# Patient Record
Sex: Female | Born: 1935 | Race: White | Hispanic: No | Marital: Single | Smoking: Former smoker
Health system: Southern US, Community
[De-identification: ages and names within clinical notes are randomized; demographics above are authoritative.]

## PROBLEM LIST (undated history)

## (undated) DIAGNOSIS — F319 Bipolar disorder, unspecified: Secondary | ICD-10-CM

## (undated) DIAGNOSIS — K219 Gastro-esophageal reflux disease without esophagitis: Secondary | ICD-10-CM

## (undated) DIAGNOSIS — E079 Disorder of thyroid, unspecified: Secondary | ICD-10-CM

## (undated) DIAGNOSIS — F329 Major depressive disorder, single episode, unspecified: Secondary | ICD-10-CM

## (undated) DIAGNOSIS — C801 Malignant (primary) neoplasm, unspecified: Secondary | ICD-10-CM

## (undated) DIAGNOSIS — I10 Essential (primary) hypertension: Secondary | ICD-10-CM

## (undated) DIAGNOSIS — C9210 Chronic myeloid leukemia, BCR/ABL-positive, not having achieved remission: Secondary | ICD-10-CM

## (undated) DIAGNOSIS — F32A Depression, unspecified: Secondary | ICD-10-CM

## (undated) HISTORY — PX: CHOLECYSTECTOMY: SHX55

## (undated) HISTORY — DX: Depression, unspecified: F32.A

## (undated) HISTORY — PX: TOTAL HIP ARTHROPLASTY: SHX124

## (undated) HISTORY — DX: Major depressive disorder, single episode, unspecified: F32.9

## (undated) HISTORY — DX: Disorder of thyroid, unspecified: E07.9

## (undated) HISTORY — DX: Gastro-esophageal reflux disease without esophagitis: K21.9

## (undated) HISTORY — PX: COLON SURGERY: SHX602

## (undated) HISTORY — PX: ABDOMINAL HYSTERECTOMY: SHX81

## (undated) HISTORY — DX: Essential (primary) hypertension: I10

---

## 1997-12-10 ENCOUNTER — Other Ambulatory Visit: Admission: RE | Admit: 1997-12-10 | Discharge: 1997-12-10 | Payer: Self-pay | Admitting: Gynecology

## 1998-01-27 ENCOUNTER — Emergency Department (HOSPITAL_COMMUNITY): Admission: EM | Admit: 1998-01-27 | Discharge: 1998-01-27 | Payer: Self-pay | Admitting: Emergency Medicine

## 1998-01-28 ENCOUNTER — Encounter (HOSPITAL_COMMUNITY): Admission: RE | Admit: 1998-01-28 | Discharge: 1998-04-28 | Payer: Self-pay

## 1998-06-20 ENCOUNTER — Inpatient Hospital Stay (HOSPITAL_COMMUNITY): Admission: EM | Admit: 1998-06-20 | Discharge: 1998-07-07 | Payer: Self-pay | Admitting: Emergency Medicine

## 1998-07-08 ENCOUNTER — Encounter (HOSPITAL_COMMUNITY): Admission: RE | Admit: 1998-07-08 | Discharge: 1998-10-06 | Payer: Self-pay | Admitting: Specialist

## 1998-07-25 ENCOUNTER — Encounter: Payer: Self-pay | Admitting: Neurology

## 1998-07-25 ENCOUNTER — Inpatient Hospital Stay (HOSPITAL_COMMUNITY): Admission: EM | Admit: 1998-07-25 | Discharge: 1998-07-27 | Payer: Self-pay | Admitting: Emergency Medicine

## 1998-07-25 ENCOUNTER — Encounter: Payer: Self-pay | Admitting: Emergency Medicine

## 1999-01-10 ENCOUNTER — Other Ambulatory Visit: Admission: RE | Admit: 1999-01-10 | Discharge: 1999-01-10 | Payer: Self-pay | Admitting: Gynecology

## 1999-01-31 ENCOUNTER — Inpatient Hospital Stay (HOSPITAL_COMMUNITY): Admission: EM | Admit: 1999-01-31 | Discharge: 1999-02-03 | Payer: Self-pay | Admitting: Emergency Medicine

## 1999-01-31 ENCOUNTER — Encounter (INDEPENDENT_AMBULATORY_CARE_PROVIDER_SITE_OTHER): Payer: Self-pay | Admitting: Specialist

## 1999-02-02 ENCOUNTER — Encounter: Payer: Self-pay | Admitting: Internal Medicine

## 1999-07-21 ENCOUNTER — Inpatient Hospital Stay (HOSPITAL_COMMUNITY): Admission: AD | Admit: 1999-07-21 | Discharge: 1999-07-25 | Payer: Self-pay | Admitting: Cardiology

## 1999-07-21 ENCOUNTER — Encounter: Payer: Self-pay | Admitting: Cardiology

## 1999-07-28 ENCOUNTER — Encounter: Payer: Self-pay | Admitting: Internal Medicine

## 1999-07-28 ENCOUNTER — Inpatient Hospital Stay (HOSPITAL_COMMUNITY): Admission: EM | Admit: 1999-07-28 | Discharge: 1999-08-01 | Payer: Self-pay | Admitting: Emergency Medicine

## 2000-06-19 ENCOUNTER — Other Ambulatory Visit (HOSPITAL_COMMUNITY): Admission: RE | Admit: 2000-06-19 | Discharge: 2000-07-05 | Payer: Self-pay | Admitting: Psychiatry

## 2000-10-02 ENCOUNTER — Encounter: Admission: RE | Admit: 2000-10-02 | Discharge: 2000-10-02 | Payer: Self-pay | Admitting: Gynecology

## 2000-10-02 ENCOUNTER — Encounter: Payer: Self-pay | Admitting: Gynecology

## 2000-11-20 ENCOUNTER — Encounter: Payer: Self-pay | Admitting: Orthopedic Surgery

## 2000-11-25 ENCOUNTER — Inpatient Hospital Stay (HOSPITAL_COMMUNITY): Admission: RE | Admit: 2000-11-25 | Discharge: 2000-12-02 | Payer: Self-pay | Admitting: Orthopedic Surgery

## 2000-11-25 ENCOUNTER — Encounter: Payer: Self-pay | Admitting: Orthopedic Surgery

## 2001-01-20 ENCOUNTER — Encounter: Admission: RE | Admit: 2001-01-20 | Discharge: 2001-01-30 | Payer: Self-pay | Admitting: Orthopedic Surgery

## 2002-01-16 ENCOUNTER — Encounter: Payer: Self-pay | Admitting: Emergency Medicine

## 2002-01-16 ENCOUNTER — Emergency Department (HOSPITAL_COMMUNITY): Admission: EM | Admit: 2002-01-16 | Discharge: 2002-01-16 | Payer: Self-pay

## 2002-02-23 ENCOUNTER — Encounter: Admission: RE | Admit: 2002-02-23 | Discharge: 2002-05-24 | Payer: Self-pay | Admitting: Orthopedic Surgery

## 2002-05-25 ENCOUNTER — Encounter: Admission: RE | Admit: 2002-05-25 | Discharge: 2002-08-23 | Payer: Self-pay | Admitting: Orthopedic Surgery

## 2002-10-08 ENCOUNTER — Encounter: Payer: Self-pay | Admitting: Gynecology

## 2002-10-08 ENCOUNTER — Encounter: Admission: RE | Admit: 2002-10-08 | Discharge: 2002-10-08 | Payer: Self-pay | Admitting: Gynecology

## 2002-11-12 ENCOUNTER — Encounter: Payer: Self-pay | Admitting: Gynecology

## 2002-11-12 ENCOUNTER — Encounter: Admission: RE | Admit: 2002-11-12 | Discharge: 2002-11-12 | Payer: Self-pay | Admitting: Gynecology

## 2002-12-01 ENCOUNTER — Encounter: Payer: Self-pay | Admitting: Family Medicine

## 2002-12-01 ENCOUNTER — Ambulatory Visit (HOSPITAL_COMMUNITY): Admission: RE | Admit: 2002-12-01 | Discharge: 2002-12-01 | Payer: Self-pay | Admitting: Family Medicine

## 2002-12-29 ENCOUNTER — Encounter: Admission: RE | Admit: 2002-12-29 | Discharge: 2003-03-18 | Payer: Self-pay | Admitting: Family Medicine

## 2003-01-26 ENCOUNTER — Ambulatory Visit (HOSPITAL_COMMUNITY): Admission: RE | Admit: 2003-01-26 | Discharge: 2003-01-26 | Payer: Self-pay | Admitting: Gastroenterology

## 2003-01-26 ENCOUNTER — Encounter (INDEPENDENT_AMBULATORY_CARE_PROVIDER_SITE_OTHER): Payer: Self-pay | Admitting: Specialist

## 2003-02-19 ENCOUNTER — Encounter: Payer: Self-pay | Admitting: General Surgery

## 2003-02-23 ENCOUNTER — Encounter: Payer: Self-pay | Admitting: General Surgery

## 2003-02-23 ENCOUNTER — Encounter (INDEPENDENT_AMBULATORY_CARE_PROVIDER_SITE_OTHER): Payer: Self-pay | Admitting: *Deleted

## 2003-02-23 ENCOUNTER — Inpatient Hospital Stay (HOSPITAL_COMMUNITY): Admission: RE | Admit: 2003-02-23 | Discharge: 2003-03-05 | Payer: Self-pay | Admitting: General Surgery

## 2003-02-27 ENCOUNTER — Encounter: Payer: Self-pay | Admitting: General Surgery

## 2003-02-28 ENCOUNTER — Encounter: Payer: Self-pay | Admitting: General Surgery

## 2003-03-01 ENCOUNTER — Encounter: Payer: Self-pay | Admitting: General Surgery

## 2003-03-02 ENCOUNTER — Encounter (INDEPENDENT_AMBULATORY_CARE_PROVIDER_SITE_OTHER): Payer: Self-pay | Admitting: Cardiology

## 2003-04-12 ENCOUNTER — Ambulatory Visit (HOSPITAL_COMMUNITY): Admission: RE | Admit: 2003-04-12 | Discharge: 2003-04-12 | Payer: Self-pay | Admitting: General Surgery

## 2003-06-24 ENCOUNTER — Inpatient Hospital Stay (HOSPITAL_COMMUNITY): Admission: EM | Admit: 2003-06-24 | Discharge: 2003-07-05 | Payer: Self-pay | Admitting: Emergency Medicine

## 2003-09-08 ENCOUNTER — Ambulatory Visit (HOSPITAL_COMMUNITY): Admission: RE | Admit: 2003-09-08 | Discharge: 2003-09-08 | Payer: Self-pay | Admitting: Hematology & Oncology

## 2003-10-21 ENCOUNTER — Encounter: Admission: RE | Admit: 2003-10-21 | Discharge: 2003-10-21 | Payer: Self-pay | Admitting: Gynecology

## 2003-12-06 ENCOUNTER — Ambulatory Visit (HOSPITAL_COMMUNITY): Admission: RE | Admit: 2003-12-06 | Discharge: 2003-12-06 | Payer: Self-pay | Admitting: Hematology & Oncology

## 2004-01-13 ENCOUNTER — Ambulatory Visit (HOSPITAL_COMMUNITY): Admission: RE | Admit: 2004-01-13 | Discharge: 2004-01-13 | Payer: Self-pay | Admitting: Family Medicine

## 2004-01-20 ENCOUNTER — Ambulatory Visit (HOSPITAL_COMMUNITY): Admission: RE | Admit: 2004-01-20 | Discharge: 2004-01-20 | Payer: Self-pay | Admitting: Family Medicine

## 2004-01-24 ENCOUNTER — Ambulatory Visit (HOSPITAL_COMMUNITY): Admission: RE | Admit: 2004-01-24 | Discharge: 2004-01-24 | Payer: Self-pay | Admitting: Gastroenterology

## 2004-02-01 ENCOUNTER — Ambulatory Visit (HOSPITAL_BASED_OUTPATIENT_CLINIC_OR_DEPARTMENT_OTHER): Admission: RE | Admit: 2004-02-01 | Discharge: 2004-02-01 | Payer: Self-pay | Admitting: General Surgery

## 2004-02-01 ENCOUNTER — Ambulatory Visit (HOSPITAL_COMMUNITY): Admission: RE | Admit: 2004-02-01 | Discharge: 2004-02-01 | Payer: Self-pay | Admitting: General Surgery

## 2004-03-19 ENCOUNTER — Ambulatory Visit: Payer: Self-pay | Admitting: Hematology & Oncology

## 2004-05-18 ENCOUNTER — Ambulatory Visit (HOSPITAL_COMMUNITY): Admission: RE | Admit: 2004-05-18 | Discharge: 2004-05-18 | Payer: Self-pay | Admitting: Hematology & Oncology

## 2004-05-18 ENCOUNTER — Emergency Department (HOSPITAL_COMMUNITY): Admission: EM | Admit: 2004-05-18 | Discharge: 2004-05-18 | Payer: Self-pay | Admitting: Emergency Medicine

## 2004-06-09 ENCOUNTER — Ambulatory Visit: Payer: Self-pay | Admitting: Hematology & Oncology

## 2004-09-01 ENCOUNTER — Ambulatory Visit: Payer: Self-pay | Admitting: Hematology & Oncology

## 2004-09-05 ENCOUNTER — Other Ambulatory Visit: Admission: RE | Admit: 2004-09-05 | Discharge: 2004-09-05 | Payer: Self-pay | Admitting: Family Medicine

## 2004-10-20 ENCOUNTER — Ambulatory Visit: Payer: Self-pay | Admitting: Hematology & Oncology

## 2004-10-23 ENCOUNTER — Ambulatory Visit (HOSPITAL_COMMUNITY): Admission: RE | Admit: 2004-10-23 | Discharge: 2004-10-23 | Payer: Self-pay | Admitting: Hematology & Oncology

## 2004-11-07 ENCOUNTER — Encounter: Admission: RE | Admit: 2004-11-07 | Discharge: 2004-11-07 | Payer: Self-pay | Admitting: Family Medicine

## 2004-11-25 ENCOUNTER — Inpatient Hospital Stay (HOSPITAL_COMMUNITY): Admission: EM | Admit: 2004-11-25 | Discharge: 2004-11-27 | Payer: Self-pay | Admitting: Emergency Medicine

## 2005-02-08 ENCOUNTER — Ambulatory Visit: Payer: Self-pay | Admitting: Hematology & Oncology

## 2005-02-12 ENCOUNTER — Ambulatory Visit (HOSPITAL_COMMUNITY): Admission: RE | Admit: 2005-02-12 | Discharge: 2005-02-12 | Payer: Self-pay | Admitting: Hematology & Oncology

## 2005-06-21 ENCOUNTER — Ambulatory Visit: Payer: Self-pay | Admitting: Hematology & Oncology

## 2005-06-25 ENCOUNTER — Ambulatory Visit (HOSPITAL_COMMUNITY): Admission: RE | Admit: 2005-06-25 | Discharge: 2005-06-25 | Payer: Self-pay | Admitting: Hematology & Oncology

## 2005-08-24 ENCOUNTER — Ambulatory Visit (HOSPITAL_COMMUNITY): Admission: RE | Admit: 2005-08-24 | Discharge: 2005-08-24 | Payer: Self-pay | Admitting: Hematology & Oncology

## 2005-11-29 ENCOUNTER — Encounter: Admission: RE | Admit: 2005-11-29 | Discharge: 2005-11-29 | Payer: Self-pay | Admitting: Family Medicine

## 2005-12-05 ENCOUNTER — Ambulatory Visit (HOSPITAL_COMMUNITY): Admission: RE | Admit: 2005-12-05 | Discharge: 2005-12-05 | Payer: Self-pay | Admitting: Hematology & Oncology

## 2005-12-20 ENCOUNTER — Ambulatory Visit: Payer: Self-pay | Admitting: Hematology & Oncology

## 2005-12-20 LAB — CBC WITH DIFFERENTIAL/PLATELET
EOS%: 0.1 % (ref 0.0–7.0)
Eosinophils Absolute: 0 10*3/uL (ref 0.0–0.5)
LYMPH%: 11.6 % — ABNORMAL LOW (ref 14.0–48.0)
MCH: 37.6 pg — ABNORMAL HIGH (ref 26.0–34.0)
MCHC: 34.1 g/dL (ref 32.0–36.0)
MCV: 110.2 fL — ABNORMAL HIGH (ref 81.0–101.0)
MONO%: 5.8 % (ref 0.0–13.0)
NEUT#: 5 10*3/uL (ref 1.5–6.5)
Platelets: 271 10*3/uL (ref 145–400)
RBC: 3.51 10*6/uL — ABNORMAL LOW (ref 3.70–5.32)

## 2005-12-20 LAB — COMPREHENSIVE METABOLIC PANEL
ALT: 12 U/L (ref 0–40)
Alkaline Phosphatase: 102 U/L (ref 39–117)
CO2: 24 mEq/L (ref 19–32)
Creatinine, Ser: 0.59 mg/dL (ref 0.40–1.20)
Total Bilirubin: 0.5 mg/dL (ref 0.3–1.2)

## 2005-12-20 LAB — CHCC SMEAR

## 2005-12-20 LAB — CEA: CEA: 1.7 ng/mL (ref 0.0–5.0)

## 2006-03-19 ENCOUNTER — Ambulatory Visit: Payer: Self-pay | Admitting: Hematology & Oncology

## 2006-03-21 LAB — CBC WITH DIFFERENTIAL/PLATELET
BASO%: 0.5 % (ref 0.0–2.0)
Basophils Absolute: 0.1 10*3/uL (ref 0.0–0.1)
EOS%: 0.3 % (ref 0.0–7.0)
HGB: 12.9 g/dL (ref 11.6–15.9)
MCH: 37.8 pg — ABNORMAL HIGH (ref 26.0–34.0)
MCHC: 33.9 g/dL (ref 32.0–36.0)
MONO#: 0.6 10*3/uL (ref 0.1–0.9)
RDW: 15.8 % — ABNORMAL HIGH (ref 11.3–14.5)
WBC: 12 10*3/uL — ABNORMAL HIGH (ref 3.9–10.0)
lymph#: 1.2 10*3/uL (ref 0.9–3.3)

## 2006-03-21 LAB — CEA: CEA: 1.9 ng/mL (ref 0.0–5.0)

## 2006-08-19 ENCOUNTER — Ambulatory Visit (HOSPITAL_COMMUNITY): Payer: Self-pay | Admitting: Psychiatry

## 2006-09-02 ENCOUNTER — Ambulatory Visit (HOSPITAL_COMMUNITY): Payer: Self-pay | Admitting: Psychiatry

## 2006-09-09 ENCOUNTER — Ambulatory Visit (HOSPITAL_COMMUNITY): Payer: Self-pay | Admitting: Licensed Clinical Social Worker

## 2006-09-09 ENCOUNTER — Ambulatory Visit: Payer: Self-pay | Admitting: Hematology & Oncology

## 2006-09-12 LAB — LACTATE DEHYDROGENASE: LDH: 194 U/L (ref 94–250)

## 2006-09-12 LAB — CBC WITH DIFFERENTIAL/PLATELET
Basophils Absolute: 0 10*3/uL (ref 0.0–0.1)
HCT: 36.6 % (ref 34.8–46.6)
HGB: 12.6 g/dL (ref 11.6–15.9)
MONO#: 0.7 10*3/uL (ref 0.1–0.9)
NEUT%: 79.5 % — ABNORMAL HIGH (ref 39.6–76.8)
WBC: 9.5 10*3/uL (ref 3.9–10.0)
lymph#: 1.1 10*3/uL (ref 0.9–3.3)

## 2006-09-12 LAB — COMPREHENSIVE METABOLIC PANEL
ALT: 15 U/L (ref 0–35)
Albumin: 4.2 g/dL (ref 3.5–5.2)
BUN: 16 mg/dL (ref 6–23)
CO2: 27 mEq/L (ref 19–32)
Calcium: 9.3 mg/dL (ref 8.4–10.5)
Chloride: 103 mEq/L (ref 96–112)
Creatinine, Ser: 0.61 mg/dL (ref 0.40–1.20)

## 2006-09-19 ENCOUNTER — Ambulatory Visit (HOSPITAL_COMMUNITY): Admission: RE | Admit: 2006-09-19 | Discharge: 2006-09-19 | Payer: Self-pay | Admitting: Hematology & Oncology

## 2006-10-08 ENCOUNTER — Ambulatory Visit (HOSPITAL_COMMUNITY): Payer: Self-pay | Admitting: Licensed Clinical Social Worker

## 2006-10-28 ENCOUNTER — Ambulatory Visit (HOSPITAL_COMMUNITY): Payer: Self-pay | Admitting: Psychiatry

## 2006-12-02 ENCOUNTER — Encounter: Admission: RE | Admit: 2006-12-02 | Discharge: 2006-12-02 | Payer: Self-pay | Admitting: Gynecology

## 2007-01-15 ENCOUNTER — Ambulatory Visit: Payer: Self-pay | Admitting: Hematology & Oncology

## 2007-01-17 LAB — COMPREHENSIVE METABOLIC PANEL
AST: 13 U/L (ref 0–37)
BUN: 10 mg/dL (ref 6–23)
CO2: 23 mEq/L (ref 19–32)
Calcium: 8.4 mg/dL (ref 8.4–10.5)
Chloride: 105 mEq/L (ref 96–112)
Creatinine, Ser: 0.58 mg/dL (ref 0.40–1.20)

## 2007-01-17 LAB — CBC WITH DIFFERENTIAL/PLATELET
BASO%: 0.2 % (ref 0.0–2.0)
Eosinophils Absolute: 0.1 10*3/uL (ref 0.0–0.5)
MCHC: 35 g/dL (ref 32.0–36.0)
MCV: 110 fL — ABNORMAL HIGH (ref 81.0–101.0)
MONO%: 5.9 % (ref 0.0–13.0)
NEUT#: 15.6 10*3/uL — ABNORMAL HIGH (ref 1.5–6.5)
RBC: 3.27 10*6/uL — ABNORMAL LOW (ref 3.70–5.32)
RDW: 15 % — ABNORMAL HIGH (ref 11.3–14.5)
WBC: 18.3 10*3/uL — ABNORMAL HIGH (ref 3.9–10.0)

## 2007-01-17 LAB — CHCC SMEAR

## 2007-01-17 LAB — LACTATE DEHYDROGENASE: LDH: 343 U/L — ABNORMAL HIGH (ref 94–250)

## 2007-01-20 ENCOUNTER — Ambulatory Visit (HOSPITAL_COMMUNITY): Payer: Self-pay | Admitting: Psychiatry

## 2007-02-18 LAB — CBC WITH DIFFERENTIAL/PLATELET
Eosinophils Absolute: 0 10*3/uL (ref 0.0–0.5)
MONO#: 1.1 10*3/uL — ABNORMAL HIGH (ref 0.1–0.9)
NEUT#: 13.7 10*3/uL — ABNORMAL HIGH (ref 1.5–6.5)
RBC: 3.1 10*6/uL — ABNORMAL LOW (ref 3.70–5.32)
RDW: 15 % — ABNORMAL HIGH (ref 11.3–14.5)
WBC: 16.5 10*3/uL — ABNORMAL HIGH (ref 3.9–10.0)
lymph#: 1.5 10*3/uL (ref 0.9–3.3)

## 2007-02-18 LAB — TECHNOLOGIST REVIEW

## 2007-02-27 LAB — BCR/ABL

## 2007-03-13 ENCOUNTER — Ambulatory Visit: Payer: Self-pay | Admitting: Hematology & Oncology

## 2007-03-17 LAB — CBC WITH DIFFERENTIAL/PLATELET
Basophils Absolute: 0.8 10*3/uL — ABNORMAL HIGH (ref 0.0–0.1)
Eosinophils Absolute: 0 10*3/uL (ref 0.0–0.5)
HGB: 12.6 g/dL (ref 11.6–15.9)
MONO#: 1.7 10*3/uL — ABNORMAL HIGH (ref 0.1–0.9)
NEUT#: 9.5 10*3/uL — ABNORMAL HIGH (ref 1.5–6.5)
RDW: 13.2 % (ref 11.3–14.5)
WBC: 13.3 10*3/uL — ABNORMAL HIGH (ref 3.9–10.0)
lymph#: 1.2 10*3/uL (ref 0.9–3.3)

## 2007-03-24 LAB — CBC WITH DIFFERENTIAL/PLATELET
Eosinophils Absolute: 0 10*3/uL (ref 0.0–0.5)
HCT: 36.5 % (ref 34.8–46.6)
LYMPH%: 13.4 % — ABNORMAL LOW (ref 14.0–48.0)
MONO#: 0.9 10*3/uL (ref 0.1–0.9)
NEUT#: 7.4 10*3/uL — ABNORMAL HIGH (ref 1.5–6.5)
NEUT%: 71.7 % (ref 39.6–76.8)
Platelets: 307 10*3/uL (ref 145–400)
RBC: 3.36 10*6/uL — ABNORMAL LOW (ref 3.70–5.32)
WBC: 10.3 10*3/uL — ABNORMAL HIGH (ref 3.9–10.0)

## 2007-03-24 LAB — TECHNOLOGIST REVIEW

## 2007-03-31 LAB — CBC WITH DIFFERENTIAL/PLATELET
BASO%: 0.2 % (ref 0.0–2.0)
Basophils Absolute: 0 10*3/uL (ref 0.0–0.1)
EOS%: 0.2 % (ref 0.0–7.0)
HCT: 35.3 % (ref 34.8–46.6)
HGB: 12.3 g/dL (ref 11.6–15.9)
MONO#: 0.4 10*3/uL (ref 0.1–0.9)
NEUT#: 8.6 10*3/uL — ABNORMAL HIGH (ref 1.5–6.5)
NEUT%: 88.6 % — ABNORMAL HIGH (ref 39.6–76.8)
RDW: 14.9 % — ABNORMAL HIGH (ref 11.3–14.5)
WBC: 9.7 10*3/uL (ref 3.9–10.0)
lymph#: 0.6 10*3/uL — ABNORMAL LOW (ref 0.9–3.3)

## 2007-04-07 LAB — CBC WITH DIFFERENTIAL/PLATELET
Basophils Absolute: 0 10*3/uL (ref 0.0–0.1)
EOS%: 0.3 % (ref 0.0–7.0)
Eosinophils Absolute: 0 10*3/uL (ref 0.0–0.5)
HCT: 34.7 % — ABNORMAL LOW (ref 34.8–46.6)
HGB: 12 g/dL (ref 11.6–15.9)
MCH: 38.1 pg — ABNORMAL HIGH (ref 26.0–34.0)
MCV: 110.2 fL — ABNORMAL HIGH (ref 81.0–101.0)
NEUT#: 3.5 10*3/uL (ref 1.5–6.5)
NEUT%: 78.9 % — ABNORMAL HIGH (ref 39.6–76.8)
RDW: 14.3 % (ref 11.3–14.5)
lymph#: 0.7 10*3/uL — ABNORMAL LOW (ref 0.9–3.3)

## 2007-04-16 LAB — CBC WITH DIFFERENTIAL/PLATELET
Basophils Absolute: 0 10*3/uL (ref 0.0–0.1)
EOS%: 0 % (ref 0.0–7.0)
Eosinophils Absolute: 0 10*3/uL (ref 0.0–0.5)
HGB: 11 g/dL — ABNORMAL LOW (ref 11.6–15.9)
LYMPH%: 12.8 % — ABNORMAL LOW (ref 14.0–48.0)
MCH: 38.3 pg — ABNORMAL HIGH (ref 26.0–34.0)
MCV: 108.6 fL — ABNORMAL HIGH (ref 81.0–101.0)
MONO%: 8.6 % (ref 0.0–13.0)
NEUT#: 2.8 10*3/uL (ref 1.5–6.5)
NEUT%: 77.7 % — ABNORMAL HIGH (ref 39.6–76.8)
Platelets: 175 10*3/uL (ref 145–400)

## 2007-04-21 LAB — CBC WITH DIFFERENTIAL/PLATELET
Basophils Absolute: 0 10*3/uL (ref 0.0–0.1)
Eosinophils Absolute: 0 10*3/uL (ref 0.0–0.5)
HCT: 34.1 % — ABNORMAL LOW (ref 34.8–46.6)
HGB: 11.9 g/dL (ref 11.6–15.9)
NEUT#: 2.8 10*3/uL (ref 1.5–6.5)
NEUT%: 74.4 % (ref 39.6–76.8)
RDW: 13.3 % (ref 11.3–14.5)
lymph#: 0.6 10*3/uL — ABNORMAL LOW (ref 0.9–3.3)

## 2007-04-24 ENCOUNTER — Ambulatory Visit: Payer: Self-pay | Admitting: Hematology & Oncology

## 2007-04-28 ENCOUNTER — Ambulatory Visit: Payer: Self-pay | Admitting: Psychiatry

## 2007-04-28 LAB — CBC WITH DIFFERENTIAL/PLATELET
Basophils Absolute: 0.1 10*3/uL (ref 0.0–0.1)
Eosinophils Absolute: 0 10*3/uL (ref 0.0–0.5)
HCT: 35.6 % (ref 34.8–46.6)
HGB: 12.4 g/dL (ref 11.6–15.9)
LYMPH%: 10.2 % — ABNORMAL LOW (ref 14.0–48.0)
MCV: 105 fL — ABNORMAL HIGH (ref 81.0–101.0)
MONO%: 12.8 % (ref 0.0–13.0)
NEUT#: 3.7 10*3/uL (ref 1.5–6.5)
NEUT%: 75.1 % (ref 39.6–76.8)
Platelets: 323 10*3/uL (ref 145–400)

## 2007-05-05 ENCOUNTER — Other Ambulatory Visit: Payer: Self-pay | Admitting: Hematology & Oncology

## 2007-05-05 LAB — CBC WITH DIFFERENTIAL/PLATELET
Eosinophils Absolute: 0 10*3/uL (ref 0.0–0.5)
LYMPH%: 12.4 % — ABNORMAL LOW (ref 14.0–48.0)
MCH: 36.2 pg — ABNORMAL HIGH (ref 26.0–34.0)
MCHC: 34.4 g/dL (ref 32.0–36.0)
MCV: 105.2 fL — ABNORMAL HIGH (ref 81.0–101.0)
MONO%: 5.5 % (ref 0.0–13.0)
NEUT#: 4.9 10*3/uL (ref 1.5–6.5)
Platelets: 343 10*3/uL (ref 145–400)
RBC: 3.56 10*6/uL — ABNORMAL LOW (ref 3.70–5.32)

## 2007-05-12 LAB — CBC WITH DIFFERENTIAL/PLATELET
BASO%: 0.9 % (ref 0.0–2.0)
LYMPH%: 16 % (ref 14.0–48.0)
MCHC: 33.9 g/dL (ref 32.0–36.0)
MONO#: 0.3 10*3/uL (ref 0.1–0.9)
MONO%: 7.2 % (ref 0.0–13.0)
Platelets: 247 10*3/uL (ref 145–400)
RBC: 3.55 10*6/uL — ABNORMAL LOW (ref 3.70–5.32)
WBC: 4.7 10*3/uL (ref 3.9–10.0)

## 2007-05-30 LAB — CBC WITH DIFFERENTIAL/PLATELET
BASO%: 0.2 % (ref 0.0–2.0)
HCT: 37.7 % (ref 34.8–46.6)
MCHC: 33.8 g/dL (ref 32.0–36.0)
MONO#: 0.3 10*3/uL (ref 0.1–0.9)
RBC: 3.73 10*6/uL (ref 3.70–5.32)
RDW: 13.4 % (ref 11.3–14.5)
WBC: 4.7 10*3/uL (ref 3.9–10.0)
lymph#: 0.6 10*3/uL — ABNORMAL LOW (ref 0.9–3.3)

## 2007-06-02 ENCOUNTER — Ambulatory Visit (HOSPITAL_COMMUNITY): Admission: RE | Admit: 2007-06-02 | Discharge: 2007-06-02 | Payer: Self-pay | Admitting: Hematology & Oncology

## 2007-06-04 ENCOUNTER — Ambulatory Visit (HOSPITAL_COMMUNITY): Payer: Self-pay | Admitting: Psychiatry

## 2007-06-09 ENCOUNTER — Ambulatory Visit: Payer: Self-pay | Admitting: Hematology & Oncology

## 2007-06-11 LAB — CBC WITH DIFFERENTIAL/PLATELET
BASO%: 0.4 % (ref 0.0–2.0)
Basophils Absolute: 0 10*3/uL (ref 0.0–0.1)
HCT: 36.8 % (ref 34.8–46.6)
HGB: 12.6 g/dL (ref 11.6–15.9)
MONO#: 0.5 10*3/uL (ref 0.1–0.9)
NEUT%: 79 % — ABNORMAL HIGH (ref 39.6–76.8)
WBC: 5.5 10*3/uL (ref 3.9–10.0)
lymph#: 0.6 10*3/uL — ABNORMAL LOW (ref 0.9–3.3)

## 2007-06-25 LAB — CBC WITH DIFFERENTIAL/PLATELET
BASO%: 0.9 % (ref 0.0–2.0)
Basophils Absolute: 0 10*3/uL (ref 0.0–0.1)
EOS%: 0.9 % (ref 0.0–7.0)
HGB: 13.6 g/dL (ref 11.6–15.9)
MCH: 33.4 pg (ref 26.0–34.0)
RDW: 13.2 % (ref 11.3–14.5)
lymph#: 0.7 10*3/uL — ABNORMAL LOW (ref 0.9–3.3)

## 2007-06-25 LAB — CHCC SMEAR

## 2007-06-26 LAB — COMPREHENSIVE METABOLIC PANEL
ALT: 12 U/L (ref 0–35)
AST: 16 U/L (ref 0–37)
Albumin: 4.3 g/dL (ref 3.5–5.2)
Alkaline Phosphatase: 95 U/L (ref 39–117)
Glucose, Bld: 125 mg/dL — ABNORMAL HIGH (ref 70–99)
Potassium: 4.2 mEq/L (ref 3.5–5.3)
Sodium: 140 mEq/L (ref 135–145)
Total Protein: 7.2 g/dL (ref 6.0–8.3)

## 2007-06-26 LAB — CEA: CEA: 2.6 ng/mL (ref 0.0–5.0)

## 2007-07-10 LAB — BCR/ABL

## 2007-07-11 LAB — CBC WITH DIFFERENTIAL/PLATELET
BASO%: 0.6 % (ref 0.0–2.0)
EOS%: 0.5 % (ref 0.0–7.0)
HCT: 43.8 % (ref 34.8–46.6)
MCH: 33 pg (ref 26.0–34.0)
MCHC: 34.9 g/dL (ref 32.0–36.0)
MONO#: 0.7 10*3/uL (ref 0.1–0.9)
NEUT%: 74.1 % (ref 39.6–76.8)
RBC: 4.63 10*6/uL (ref 3.70–5.32)
WBC: 6.5 10*3/uL (ref 3.9–10.0)
lymph#: 0.9 10*3/uL (ref 0.9–3.3)

## 2007-07-18 ENCOUNTER — Ambulatory Visit: Payer: Self-pay | Admitting: Psychology

## 2007-07-18 ENCOUNTER — Encounter: Admission: RE | Admit: 2007-07-18 | Discharge: 2007-07-21 | Payer: Self-pay | Admitting: Family Medicine

## 2007-08-01 ENCOUNTER — Ambulatory Visit: Payer: Self-pay | Admitting: Hematology & Oncology

## 2007-09-03 LAB — CBC WITH DIFFERENTIAL/PLATELET
BASO%: 0.1 % (ref 0.0–2.0)
Basophils Absolute: 0 10*3/uL (ref 0.0–0.1)
EOS%: 0.8 % (ref 0.0–7.0)
HCT: 37.4 % (ref 34.8–46.6)
HGB: 13.1 g/dL (ref 11.6–15.9)
LYMPH%: 15.9 % (ref 14.0–48.0)
MCH: 32.9 pg (ref 26.0–34.0)
MCHC: 34.9 g/dL (ref 32.0–36.0)
MCV: 94.3 fL (ref 81.0–101.0)
MONO%: 7.7 % (ref 0.0–13.0)
NEUT%: 75.5 % (ref 39.6–76.8)
Platelets: 249 10*3/uL (ref 145–400)

## 2007-09-03 LAB — CHCC SMEAR

## 2007-09-17 LAB — BCR/ABL

## 2007-10-27 ENCOUNTER — Ambulatory Visit: Payer: Self-pay | Admitting: Hematology & Oncology

## 2007-10-29 LAB — CBC WITH DIFFERENTIAL/PLATELET
BASO%: 0.7 % (ref 0.0–2.0)
Eosinophils Absolute: 0 10*3/uL (ref 0.0–0.5)
LYMPH%: 13.9 % — ABNORMAL LOW (ref 14.0–48.0)
MCHC: 34.3 g/dL (ref 32.0–36.0)
MCV: 95.1 fL (ref 81.0–101.0)
MONO#: 0.3 10*3/uL (ref 0.1–0.9)
MONO%: 6.2 % (ref 0.0–13.0)
NEUT#: 4.1 10*3/uL (ref 1.5–6.5)
Platelets: 321 10*3/uL (ref 145–400)
RBC: 4.2 10*6/uL (ref 3.70–5.32)
RDW: 13.4 % (ref 11.3–14.5)
WBC: 5.3 10*3/uL (ref 3.9–10.0)

## 2007-10-29 LAB — COMPREHENSIVE METABOLIC PANEL
ALT: 12 U/L (ref 0–35)
AST: 15 U/L (ref 0–37)
CO2: 23 mEq/L (ref 19–32)
Creatinine, Ser: 0.66 mg/dL (ref 0.40–1.20)
Sodium: 136 mEq/L (ref 135–145)
Total Bilirubin: 0.4 mg/dL (ref 0.3–1.2)
Total Protein: 7.3 g/dL (ref 6.0–8.3)

## 2007-10-29 LAB — CHCC SMEAR

## 2007-12-25 ENCOUNTER — Encounter: Admission: RE | Admit: 2007-12-25 | Discharge: 2007-12-25 | Payer: Self-pay | Admitting: Gynecology

## 2008-01-13 ENCOUNTER — Ambulatory Visit (HOSPITAL_COMMUNITY): Admission: RE | Admit: 2008-01-13 | Discharge: 2008-01-13 | Payer: Self-pay | Admitting: Hematology & Oncology

## 2008-01-21 ENCOUNTER — Ambulatory Visit: Payer: Self-pay | Admitting: Hematology & Oncology

## 2008-01-22 LAB — CHCC SATELLITE - SMEAR

## 2008-01-22 LAB — COMPREHENSIVE METABOLIC PANEL
ALT: 12 U/L (ref 0–35)
Albumin: 4.7 g/dL (ref 3.5–5.2)
CO2: 21 mEq/L (ref 19–32)
Glucose, Bld: 130 mg/dL — ABNORMAL HIGH (ref 70–99)
Potassium: 4 mEq/L (ref 3.5–5.3)
Sodium: 140 mEq/L (ref 135–145)
Total Protein: 7.7 g/dL (ref 6.0–8.3)

## 2008-01-22 LAB — MANUAL DIFFERENTIAL (CHCC SATELLITE)
ALC: 0.9 10*3/uL (ref 0.6–2.2)
ANC (CHCC HP manual diff): 2.5 10*3/uL (ref 1.5–6.7)
PLT EST ~~LOC~~: ADEQUATE

## 2008-01-22 LAB — CBC WITH DIFFERENTIAL (CANCER CENTER ONLY)
Platelets: 214 10*3/uL (ref 145–400)
RDW: 12.2 % (ref 10.5–14.6)
WBC: 3.9 10*3/uL (ref 3.9–10.0)

## 2008-03-09 ENCOUNTER — Ambulatory Visit: Payer: Self-pay | Admitting: Hematology & Oncology

## 2008-03-11 LAB — CBC WITH DIFFERENTIAL/PLATELET
BASO%: 0.6 % (ref 0.0–2.0)
Basophils Absolute: 0 10*3/uL (ref 0.0–0.1)
EOS%: 0.7 % (ref 0.0–7.0)
HGB: 13.3 g/dL (ref 11.6–15.9)
MCH: 33.1 pg (ref 26.0–34.0)
MCHC: 34.8 g/dL (ref 32.0–36.0)
MONO%: 7.4 % (ref 0.0–13.0)
RBC: 4.03 10*6/uL (ref 3.70–5.32)
RDW: 13.2 % (ref 11.3–14.5)
lymph#: 0.9 10*3/uL (ref 0.9–3.3)

## 2008-04-21 ENCOUNTER — Ambulatory Visit: Payer: Self-pay | Admitting: Hematology & Oncology

## 2008-05-05 LAB — CBC WITH DIFFERENTIAL (CANCER CENTER ONLY)
BASO%: 0.4 % (ref 0.0–2.0)
EOS%: 2.1 % (ref 0.0–7.0)
LYMPH%: 17.8 % (ref 14.0–48.0)
MCH: 32 pg (ref 26.0–34.0)
MCHC: 34 g/dL (ref 32.0–36.0)
MCV: 94 fL (ref 81–101)
MONO%: 6.9 % (ref 0.0–13.0)
Platelets: 290 10*3/uL (ref 145–400)
RDW: 11.4 % (ref 10.5–14.6)
WBC: 4.4 10*3/uL (ref 3.9–10.0)

## 2008-05-05 LAB — CHCC SATELLITE - SMEAR

## 2008-05-17 LAB — BCR/ABL (CHCC SATELLITE)

## 2008-07-12 ENCOUNTER — Ambulatory Visit (HOSPITAL_COMMUNITY): Payer: Self-pay | Admitting: Psychiatry

## 2008-07-20 ENCOUNTER — Ambulatory Visit: Payer: Self-pay | Admitting: Hematology & Oncology

## 2008-07-21 LAB — CBC WITH DIFFERENTIAL (CANCER CENTER ONLY)
BASO#: 0 10*3/uL (ref 0.0–0.2)
Eosinophils Absolute: 0.1 10*3/uL (ref 0.0–0.5)
LYMPH%: 10.1 % — ABNORMAL LOW (ref 14.0–48.0)
MCH: 31.9 pg (ref 26.0–34.0)
MCV: 96 fL (ref 81–101)
MONO%: 3.6 % (ref 0.0–13.0)
Platelets: 316 10*3/uL (ref 145–400)
RBC: 4.15 10*6/uL (ref 3.70–5.32)

## 2008-07-28 LAB — BCR/ABL (CHCC SATELLITE)

## 2008-10-19 ENCOUNTER — Ambulatory Visit: Payer: Self-pay | Admitting: Hematology & Oncology

## 2008-10-28 ENCOUNTER — Ambulatory Visit: Payer: Self-pay | Admitting: Hematology & Oncology

## 2008-10-28 LAB — COMPREHENSIVE METABOLIC PANEL
AST: 32 U/L (ref 0–37)
BUN: 18 mg/dL (ref 6–23)
Calcium: 9.1 mg/dL (ref 8.4–10.5)
Chloride: 107 mEq/L (ref 96–112)
Creatinine, Ser: 0.68 mg/dL (ref 0.40–1.20)
Glucose, Bld: 115 mg/dL — ABNORMAL HIGH (ref 70–99)

## 2008-10-28 LAB — CBC WITH DIFFERENTIAL/PLATELET
Basophils Absolute: 0.1 10*3/uL (ref 0.0–0.1)
EOS%: 2.1 % (ref 0.0–7.0)
Eosinophils Absolute: 0.1 10*3/uL (ref 0.0–0.5)
HCT: 38.7 % (ref 34.8–46.6)
HGB: 13.8 g/dL (ref 11.6–15.9)
MCH: 33.9 pg (ref 25.1–34.0)
MCV: 94.9 fL (ref 79.5–101.0)
MONO%: 9.1 % (ref 0.0–14.0)
NEUT%: 69.9 % (ref 38.4–76.8)
lymph#: 1.1 10*3/uL (ref 0.9–3.3)

## 2008-11-18 ENCOUNTER — Ambulatory Visit (HOSPITAL_COMMUNITY): Admission: RE | Admit: 2008-11-18 | Discharge: 2008-11-18 | Payer: Self-pay | Admitting: Hematology & Oncology

## 2008-12-29 ENCOUNTER — Ambulatory Visit (HOSPITAL_COMMUNITY): Payer: Self-pay | Admitting: Psychiatry

## 2009-02-01 ENCOUNTER — Encounter: Admission: RE | Admit: 2009-02-01 | Discharge: 2009-02-01 | Payer: Self-pay | Admitting: Gynecology

## 2009-02-09 ENCOUNTER — Ambulatory Visit: Payer: Self-pay | Admitting: Hematology & Oncology

## 2009-02-10 LAB — CBC WITH DIFFERENTIAL (CANCER CENTER ONLY)
BASO#: 0 10*3/uL (ref 0.0–0.2)
Eosinophils Absolute: 0.1 10*3/uL (ref 0.0–0.5)
HCT: 36.5 % (ref 34.8–46.6)
HGB: 12.8 g/dL (ref 11.6–15.9)
MCH: 32.9 pg (ref 26.0–34.0)
MCV: 94 fL (ref 81–101)
MONO%: 6.6 % (ref 0.0–13.0)
NEUT#: 2.6 10*3/uL (ref 1.5–6.5)
RBC: 3.9 10*6/uL (ref 3.70–5.32)

## 2009-02-10 LAB — COMPREHENSIVE METABOLIC PANEL
AST: 17 U/L (ref 0–37)
Albumin: 4.2 g/dL (ref 3.5–5.2)
Alkaline Phosphatase: 66 U/L (ref 39–117)
Potassium: 4.3 mEq/L (ref 3.5–5.3)
Sodium: 129 mEq/L — ABNORMAL LOW (ref 135–145)
Total Protein: 6.6 g/dL (ref 6.0–8.3)

## 2009-02-10 LAB — CHCC SATELLITE - SMEAR

## 2009-02-21 LAB — BCR/ABL (CHCC SATELLITE)

## 2009-04-11 ENCOUNTER — Ambulatory Visit (HOSPITAL_COMMUNITY): Payer: Self-pay | Admitting: Psychiatry

## 2009-04-13 ENCOUNTER — Ambulatory Visit: Payer: Self-pay | Admitting: Hematology & Oncology

## 2009-04-14 LAB — CBC WITH DIFFERENTIAL (CANCER CENTER ONLY)
BASO#: 0 10*3/uL (ref 0.0–0.2)
EOS%: 1.1 % (ref 0.0–7.0)
Eosinophils Absolute: 0.1 10*3/uL (ref 0.0–0.5)
HGB: 12.9 g/dL (ref 11.6–15.9)
LYMPH#: 0.8 10*3/uL — ABNORMAL LOW (ref 0.9–3.3)
MONO#: 0.3 10*3/uL (ref 0.1–0.9)
NEUT#: 3.1 10*3/uL (ref 1.5–6.5)
RBC: 3.96 10*6/uL (ref 3.70–5.32)

## 2009-04-15 LAB — COMPREHENSIVE METABOLIC PANEL
CO2: 22 mEq/L (ref 19–32)
Creatinine, Ser: 0.77 mg/dL (ref 0.40–1.20)
Glucose, Bld: 97 mg/dL (ref 70–99)
Total Bilirubin: 0.5 mg/dL (ref 0.3–1.2)

## 2009-04-15 LAB — VITAMIN D 25 HYDROXY (VIT D DEFICIENCY, FRACTURES): Vit D, 25-Hydroxy: 8 ng/mL — ABNORMAL LOW (ref 30–89)

## 2009-04-21 LAB — BCR/ABL (CHCC SATELLITE)

## 2009-06-14 ENCOUNTER — Ambulatory Visit: Payer: Self-pay | Admitting: Hematology & Oncology

## 2009-07-20 ENCOUNTER — Ambulatory Visit: Payer: Self-pay | Admitting: Hematology & Oncology

## 2009-07-21 LAB — COMPREHENSIVE METABOLIC PANEL
Albumin: 4.5 g/dL (ref 3.5–5.2)
Alkaline Phosphatase: 66 U/L (ref 39–117)
BUN: 12 mg/dL (ref 6–23)
Calcium: 9.6 mg/dL (ref 8.4–10.5)
Chloride: 99 mEq/L (ref 96–112)
Glucose, Bld: 138 mg/dL — ABNORMAL HIGH (ref 70–99)
Potassium: 3.9 mEq/L (ref 3.5–5.3)

## 2009-07-21 LAB — CEA: CEA: 1.8 ng/mL (ref 0.0–5.0)

## 2009-07-21 LAB — CBC WITH DIFFERENTIAL (CANCER CENTER ONLY)
BASO%: 0.2 % (ref 0.0–2.0)
HCT: 36.9 % (ref 34.8–46.6)
LYMPH%: 14.8 % (ref 14.0–48.0)
MCHC: 33.8 g/dL (ref 32.0–36.0)
MCV: 97 fL (ref 81–101)
MONO#: 0.3 10*3/uL (ref 0.1–0.9)
NEUT%: 78 % (ref 39.6–80.0)
RDW: 10.2 % — ABNORMAL LOW (ref 10.5–14.6)
WBC: 4.6 10*3/uL (ref 3.9–10.0)

## 2009-07-21 LAB — VITAMIN D 25 HYDROXY (VIT D DEFICIENCY, FRACTURES): Vit D, 25-Hydroxy: 30 ng/mL (ref 30–89)

## 2009-09-07 ENCOUNTER — Ambulatory Visit (HOSPITAL_COMMUNITY): Payer: Self-pay | Admitting: Psychiatry

## 2009-10-17 ENCOUNTER — Ambulatory Visit: Payer: Self-pay | Admitting: Hematology & Oncology

## 2009-10-19 LAB — CBC WITH DIFFERENTIAL (CANCER CENTER ONLY)
BASO%: 0.6 % (ref 0.0–2.0)
EOS%: 2 % (ref 0.0–7.0)
MCH: 32.8 pg (ref 26.0–34.0)
MCHC: 34.1 g/dL (ref 32.0–36.0)
MONO%: 7.6 % (ref 0.0–13.0)
NEUT#: 2.5 10*3/uL (ref 1.5–6.5)
Platelets: 238 10*3/uL (ref 145–400)

## 2009-10-19 LAB — CHCC SATELLITE - SMEAR

## 2009-10-19 LAB — COMPREHENSIVE METABOLIC PANEL
ALT: 15 U/L (ref 0–35)
AST: 17 U/L (ref 0–37)
Alkaline Phosphatase: 72 U/L (ref 39–117)
Creatinine, Ser: 0.66 mg/dL (ref 0.40–1.20)
Total Bilirubin: 0.4 mg/dL (ref 0.3–1.2)

## 2009-10-26 LAB — BCR/ABL (CHCC SATELLITE)

## 2009-11-01 ENCOUNTER — Ambulatory Visit (HOSPITAL_COMMUNITY): Admission: RE | Admit: 2009-11-01 | Discharge: 2009-11-01 | Payer: Self-pay | Admitting: Hematology & Oncology

## 2009-11-30 ENCOUNTER — Ambulatory Visit (HOSPITAL_COMMUNITY): Payer: Self-pay | Admitting: Psychiatry

## 2009-12-08 ENCOUNTER — Ambulatory Visit (HOSPITAL_COMMUNITY): Admission: RE | Admit: 2009-12-08 | Discharge: 2009-12-08 | Payer: Self-pay | Admitting: Neurology

## 2010-01-11 ENCOUNTER — Encounter: Admission: RE | Admit: 2010-01-11 | Discharge: 2010-02-09 | Payer: Self-pay | Admitting: Neurology

## 2010-01-18 ENCOUNTER — Ambulatory Visit: Payer: Self-pay | Admitting: Hematology & Oncology

## 2010-01-23 LAB — CBC WITH DIFFERENTIAL/PLATELET
Eosinophils Absolute: 0.1 10*3/uL (ref 0.0–0.5)
LYMPH%: 19.9 % (ref 14.0–49.7)
MCHC: 33.7 g/dL (ref 31.5–36.0)
MCV: 96.4 fL (ref 79.5–101.0)
MONO%: 9.2 % (ref 0.0–14.0)
NEUT#: 3.9 10*3/uL (ref 1.5–6.5)
NEUT%: 69.5 % (ref 38.4–76.8)
Platelets: 301 10*3/uL (ref 145–400)
RBC: 4.08 10*6/uL (ref 3.70–5.45)

## 2010-01-23 LAB — COMPREHENSIVE METABOLIC PANEL
Alkaline Phosphatase: 64 U/L (ref 39–117)
Calcium: 10.2 mg/dL (ref 8.4–10.5)
Creatinine, Ser: 0.73 mg/dL (ref 0.40–1.20)
Glucose, Bld: 106 mg/dL — ABNORMAL HIGH (ref 70–99)
Sodium: 143 mEq/L (ref 135–145)
Total Bilirubin: 0.4 mg/dL (ref 0.3–1.2)
Total Protein: 7.4 g/dL (ref 6.0–8.3)

## 2010-02-02 LAB — BCR/ABL

## 2010-02-17 ENCOUNTER — Ambulatory Visit (HOSPITAL_COMMUNITY): Payer: Self-pay | Admitting: Psychiatry

## 2010-02-24 ENCOUNTER — Encounter: Admission: RE | Admit: 2010-02-24 | Discharge: 2010-02-24 | Payer: Self-pay | Admitting: Gynecology

## 2010-02-28 ENCOUNTER — Encounter
Admission: RE | Admit: 2010-02-28 | Discharge: 2010-04-11 | Payer: Self-pay | Source: Home / Self Care | Admitting: Neurology

## 2010-04-20 ENCOUNTER — Ambulatory Visit: Payer: Self-pay | Admitting: Hematology & Oncology

## 2010-04-24 LAB — CBC WITH DIFFERENTIAL/PLATELET
BASO%: 0.8 % (ref 0.0–2.0)
MCHC: 34.2 g/dL (ref 31.5–36.0)
MONO#: 0.4 10*3/uL (ref 0.1–0.9)
RBC: 4.11 10*6/uL (ref 3.70–5.45)
RDW: 12.8 % (ref 11.2–14.5)
WBC: 6.7 10*3/uL (ref 3.9–10.3)
lymph#: 1 10*3/uL (ref 0.9–3.3)

## 2010-04-24 LAB — COMPREHENSIVE METABOLIC PANEL
ALT: 18 U/L (ref 0–35)
AST: 22 U/L (ref 0–37)
CO2: 27 mEq/L (ref 19–32)
Calcium: 9.6 mg/dL (ref 8.4–10.5)
Chloride: 101 mEq/L (ref 96–112)
Sodium: 137 mEq/L (ref 135–145)
Total Protein: 6.7 g/dL (ref 6.0–8.3)

## 2010-05-15 ENCOUNTER — Ambulatory Visit (HOSPITAL_COMMUNITY): Payer: Self-pay | Admitting: Psychiatry

## 2010-05-27 ENCOUNTER — Inpatient Hospital Stay (HOSPITAL_COMMUNITY)
Admission: EM | Admit: 2010-05-27 | Discharge: 2010-05-30 | Payer: Self-pay | Source: Home / Self Care | Attending: Urology | Admitting: Urology

## 2010-05-29 LAB — BASIC METABOLIC PANEL
BUN: 16 mg/dL (ref 6–23)
CO2: 22 mEq/L (ref 19–32)
Calcium: 8.5 mg/dL (ref 8.4–10.5)
Chloride: 106 mEq/L (ref 96–112)
Creatinine, Ser: 0.92 mg/dL (ref 0.4–1.2)
GFR calc Af Amer: >60 mL/min (ref >60)
GFR calc non Af Amer: 59 mL/min — ABNORMAL LOW (ref >60)
Glucose, Bld: 192 mg/dL — ABNORMAL HIGH (ref 70–99)
Potassium: 3.4 mEq/L — ABNORMAL LOW (ref 3.5–5.1)
Sodium: 137 mEq/L (ref 135–145)

## 2010-05-29 LAB — DIFFERENTIAL
Basophils Absolute: 0 10*3/uL (ref 0.0–0.1)
Basophils Absolute: 0 10*3/uL (ref 0.0–0.1)
Basophils Relative: 0 % (ref 0–1)
Basophils Relative: 0 % (ref 0–1)
Eosinophils Absolute: 0 10*3/uL (ref 0.0–0.7)
Eosinophils Absolute: 0 10*3/uL (ref 0.0–0.7)
Eosinophils Relative: 0 % (ref 0–5)
Eosinophils Relative: 0 % (ref 0–5)
Lymphocytes Relative: 2 % — ABNORMAL LOW (ref 12–46)
Lymphocytes Relative: 2 % — ABNORMAL LOW (ref 12–46)
Lymphs Abs: 0.3 10*3/uL — ABNORMAL LOW (ref 0.7–4.0)
Lymphs Abs: 0.2 10*3/uL — ABNORMAL LOW (ref 0.7–4.0)
Monocytes Absolute: 0.8 10*3/uL (ref 0.1–1.0)
Monocytes Absolute: 0.3 10*3/uL (ref 0.1–1.0)
Monocytes Relative: 5 % (ref 3–12)
Monocytes Relative: 3 % (ref 3–12)
Neutro Abs: 13.1 10*3/uL — ABNORMAL HIGH (ref 1.7–7.7)
Neutro Abs: 10.9 10*3/uL — ABNORMAL HIGH (ref 1.7–7.7)
Neutrophils Relative %: 92 % — ABNORMAL HIGH (ref 43–77)
Neutrophils Relative %: 95 % — ABNORMAL HIGH (ref 43–77)

## 2010-05-29 LAB — COMPREHENSIVE METABOLIC PANEL
ALT: 26 U/L (ref 0–35)
AST: 36 U/L (ref 0–37)
Albumin: 3.7 g/dL (ref 3.5–5.2)
Alkaline Phosphatase: 64 U/L (ref 39–117)
BUN: 18 mg/dL (ref 6–23)
CO2: 24 mEq/L (ref 19–32)
Calcium: 9.3 mg/dL (ref 8.4–10.5)
Chloride: 107 mEq/L (ref 96–112)
Creatinine, Ser: 0.83 mg/dL (ref 0.4–1.2)
GFR calc Af Amer: >60 mL/min (ref >60)
GFR calc non Af Amer: >60 mL/min (ref >60)
Glucose, Bld: 152 mg/dL — ABNORMAL HIGH (ref 70–99)
Potassium: 3.7 mEq/L (ref 3.5–5.1)
Sodium: 140 mEq/L (ref 135–145)
Total Bilirubin: 0.7 mg/dL (ref 0.3–1.2)
Total Protein: 6.8 g/dL (ref 6.0–8.3)

## 2010-05-29 LAB — URINALYSIS, ROUTINE W REFLEX MICROSCOPIC
Bilirubin Urine: NEGATIVE
Ketones, ur: NEGATIVE mg/dL
Nitrite: POSITIVE — AB
Protein, ur: 30 mg/dL — AB
Specific Gravity, Urine: 1.022 (ref 1.005–1.030)
Urine Glucose, Fasting: NEGATIVE mg/dL
Urobilinogen, UA: 1 mg/dL (ref 0.0–1.0)
pH: 6 (ref 5.0–8.0)

## 2010-05-29 LAB — CBC
HCT: 38.6 % (ref 36.0–46.0)
HCT: 34.5 % — ABNORMAL LOW (ref 36.0–46.0)
Hemoglobin: 13 g/dL (ref 12.0–15.0)
Hemoglobin: 11.5 g/dL — ABNORMAL LOW (ref 12.0–15.0)
MCH: 32.1 pg (ref 26.0–34.0)
MCH: 32.2 pg (ref 26.0–34.0)
MCHC: 33.7 g/dL (ref 30.0–36.0)
MCHC: 33.3 g/dL (ref 30.0–36.0)
MCV: 95.3 fL (ref 78.0–100.0)
MCV: 96.6 fL (ref 78.0–100.0)
Platelets: 198 10*3/uL (ref 150–400)
Platelets: 160 10*3/uL (ref 150–400)
RBC: 4.05 MIL/uL (ref 3.87–5.11)
RBC: 3.57 MIL/uL — ABNORMAL LOW (ref 3.87–5.11)
RDW: 13 % (ref 11.5–15.5)
RDW: 13.3 % (ref 11.5–15.5)
WBC: 14.2 10*3/uL — ABNORMAL HIGH (ref 4.0–10.5)
WBC: 11.5 10*3/uL — ABNORMAL HIGH (ref 4.0–10.5)

## 2010-05-29 LAB — URINE MICROSCOPIC-ADD ON

## 2010-05-31 LAB — CBC
HCT: 31.1 % — ABNORMAL LOW (ref 36.0–46.0)
Hemoglobin: 10.3 g/dL — ABNORMAL LOW (ref 12.0–15.0)
MCH: 32 pg (ref 26.0–34.0)
MCHC: 33.1 g/dL (ref 30.0–36.0)
MCV: 96.6 fL (ref 78.0–100.0)
Platelets: 143 10*3/uL — ABNORMAL LOW (ref 150–400)
RBC: 3.22 MIL/uL — ABNORMAL LOW (ref 3.87–5.11)
RDW: 13.7 % (ref 11.5–15.5)
WBC: 9.5 10*3/uL (ref 4.0–10.5)

## 2010-05-31 LAB — URINE CULTURE
Colony Count: >=100000
Culture  Setup Time: 201201150322

## 2010-06-03 ENCOUNTER — Encounter: Payer: Self-pay | Admitting: Hematology & Oncology

## 2010-06-04 ENCOUNTER — Encounter: Payer: Self-pay | Admitting: Family Medicine

## 2010-06-04 NOTE — Consult Note (Signed)
[name], LAUSCH NO.:  [id number]  MEDICAL RECORD NO.:  [id number]          PATIENT TYPE:  EMS  LOCATION:  ED                           FACILITY:  [facility]  PHYSICIAN:  Heloise Purpura, MD      DATE OF BIRTH:  [birth year]  DATE OF CONSULTATION:  05/28/2010 DATE OF DISCHARGE:                                CONSULTATION   REQUESTING PHYSICIAN:  Paula Libra, MD  REASON FOR CONSULTATION:  Right ureteral stone and infection.  HISTORY:  [name] is an 77 year old female followed by Dr. Bjorn Pippin for a history of urolithiasis.  She has a known large right renal calculus and began experiencing moderate-to-severe right-sided abdominal pain earlier today, was brought her to the emergency department.  She has denied any fever or subjective chills and has had some nausea but no vomiting.  On evaluation in the emergency department, she was found to have a mild fever with a temperature of 100.0 degrees Fahrenheit.  She was also noted to have a urinary tract infection and elevated white blood count of 14.2.  A CT stone study was performed and revealed a  12 x 6 mm proximal right ureteral calculus with right hydronephrosis.  PAST MEDICAL HISTORY: 1. Bipolar disorder. 2. CLL. 3. Colon cancer. 4. Depression. 5. Gastroesophageal reflux disease. 6. Hypertension. 7. Hyperparathyroidism.  PAST SURGICAL HISTORY: 1. Cholecystectomy. 2. Colectomy. 3. Hip surgery. 4. Hysterectomy.  MEDICATIONS: 1. Boniva. 2. Gleevec. 3. Metoprolol. 4. Oxcarbazepine. 5. Paroxetine. 6. Vitamin D.  ALLERGIES:  No known drug allergies.  FAMILY HISTORY:  No history of GU malignancy.  SOCIAL HISTORY:  The patient lives with her husband and is married.  She denies alcohol or tobacco use.  REVIEW OF SYSTEMS:  Positive for nausea.  All other systems are reviewed and are otherwise negative.  PHYSICAL EXAMINATION:  VITAL SIGNS:  Temperature 100.0, pulse 80, respirations 18, blood  pressure 126/67. CONSTITUTIONAL:  Well-nourished, well-developed age-appropriate female, in no acute distress. HEENT:  Normocephalic, atraumatic. NECK:  Supple without lymphadenopathy. CARDIOVASCULAR:  Regular rate and rhythm without obvious murmurs. LUNGS:  Clear bilaterally. ABDOMEN:  She has mild right upper and lower quadrant tenderness without rebound tenderness or guarding.  No CVA tenderness. EXTREMITIES:  No edema. SKIN:  No visible skin lesions. NEUROLOGIC:  Grossly intact.  LABORATORY DATA:  Urinalysis demonstrates too numerous to count white blood cells and many bacteria as well as too numerous to count red blood cells.  Her serum creatinine is 0.83 and white blood count is 14.2.  RADIOLOGIC IMAGING:  A CT scan of the abdomen and pelvis without contrast was performed which demonstrates a 1.2 x 0.6 cm right proximal ureteral calculus with right-sided hydronephrosis.  She also has nonobstructing left renal calculi.  IMPRESSION:  Obstructing right ureteral stone with urinary tract infection and fever.  PLAN:  I have recommended that she proceed to the operating room for cystoscopy and right ureteral stent placement and have discussed the potential risks and complications associated with this procedure.  She understands that she will then require admission to the hospital for IV antibiotics and subsequent outpatient antibiotic therapy  followed by definitive management of her stone by Dr. Annabell Howells.     Heloise Purpura, MD     LB/MEDQ  D:  05/28/2010  T:  05/28/2010  Job:  [id number]  cc:   Excell Seltzer. Annabell Howells, M.D. Fax: [phone]  Paula Libra, MD Fax: [phone]  Electronically Signed by Heloise Purpura MD on 06/04/2010 05:18:22 PM

## 2010-06-04 NOTE — Op Note (Signed)
[name], [name] NO.:  [id number]  MEDICAL RECORD NO.:  [id number]          PATIENT TYPE:  INP  LOCATION:  0098                         FACILITY:  [facility]  PHYSICIAN:  Heloise Purpura, MD      DATE OF BIRTH:  [birth year]  DATE OF PROCEDURE:  05/28/2010 DATE OF DISCHARGE:                              OPERATIVE REPORT   PREOPERATIVE DIAGNOSES: 1. Obstructing right ureteral calculus. 2. Fever with urinary tract infection.  POSTOPERATIVE DIAGNOSES: 1. Obstructing right ureteral calculus. 2. Fever with urinary tract infection.  PROCEDURES.: 1. Cystoscopy. 2. Right retrograde pyelography. 3. Right ureteral stent placement (6 x 24).  SURGEON:  Heloise Purpura, MD  ANESTHESIA:  General.  COMPLICATIONS:  None.  INTRAOPERATIVE FINDINGS:  On fluoroscopy, the patient was noted to have a radiopaque calcification at the level of the ureteropelvic junction consistent with her known proximal ureteral stone.  Omnipaque contrast was injected during the procedure which did not demonstrate any filling defects within the renal pelvis, although the renal pelvis was not completely filled out in an attempt to prevent high pressure within the renal collecting system considering the concern for infection.  INDICATIONS:  Ms. Mingle is an 77 year old female with a history of urolithiasis who presented with a fever to 100 degrees Fahrenheit, a leukocytosis, and a urinary tract infection along with an obstructing right ureteral calculus.  It was recommended that she proceed to the operating room for right ureteral stent drainage and admission to the hospital for intravenous antibiotic therapy.  After the potential risks and complications were discussed in detail, she consented and proceeded in this fashion.  DESCRIPTION OF PROCEDURE:  The patient was taken to the operating room and a general anesthetic was administered.  She was given preoperative antibiotics, placed in the  dorsal lithotomy position, and prepped and draped in usual sterile fashion.  Next, preoperative time-out was performed.  Cystourethroscopy was then performed which revealed a normal urethra and no abnormalities within the bladder.  The ureteral orifices were in the normal anatomic position.  The right ureteral orifice was then identified and cannulated with a 6-French ureteral catheter and a 0.038 sensor guidewire was advanced up to the level of the stone.  This was able to be passed by the stone and presumably into the renal pelvis, although the kidney did appear to be lined somewhat low.  To confirm proper location, the 6-French ureteral catheter was advanced over the wire past the stone and into the presumed renal collecting system.  A small amount of Omnipaque contrast was injected with care not to inject under high pressure.  This confirmed placement within the renal collecting system and the wire was then replaced through the ureteral catheter and the ureteral catheter was removed.  A  6 x 24 double-J ureteral stent was then advanced over the wire using Seldinger technique and positioned appropriately under fluoroscopic and cystoscopic guidance.  The wire was then removed with a good curl noted in the renal pelvis as well as in the bladder.  The bladder was emptied and the procedure was ended.  She tolerated the procedure well and without complications.  She was able to be extubated and transferred to recovery unit in satisfactory condition.     Heloise Purpura, MD     LB/MEDQ  D:  05/28/2010  T:  05/28/2010  Job:  [id number]  Electronically Signed by Heloise Purpura MD on 06/04/2010 05:18:24 PM

## 2010-06-08 ENCOUNTER — Ambulatory Visit (HOSPITAL_COMMUNITY)
Admission: RE | Admit: 2010-06-08 | Discharge: 2010-06-08 | Payer: Self-pay | Source: Home / Self Care | Attending: Urology | Admitting: Urology

## 2010-06-21 ENCOUNTER — Encounter (INDEPENDENT_AMBULATORY_CARE_PROVIDER_SITE_OTHER): Payer: BC Managed Care – PPO | Admitting: Psychiatry

## 2010-06-21 DIAGNOSIS — F3189 Other bipolar disorder: Secondary | ICD-10-CM

## 2010-07-18 ENCOUNTER — Encounter (HOSPITAL_BASED_OUTPATIENT_CLINIC_OR_DEPARTMENT_OTHER): Payer: Medicare Other | Admitting: Oncology

## 2010-07-18 ENCOUNTER — Other Ambulatory Visit: Payer: Self-pay | Admitting: Oncology

## 2010-07-18 DIAGNOSIS — Z85038 Personal history of other malignant neoplasm of large intestine: Secondary | ICD-10-CM

## 2010-07-18 DIAGNOSIS — C9210 Chronic myeloid leukemia, BCR/ABL-positive, not having achieved remission: Secondary | ICD-10-CM

## 2010-07-18 LAB — CBC WITH DIFFERENTIAL/PLATELET
BASO%: 0.5 % (ref 0.0–2.0)
EOS%: 2.8 % (ref 0.0–7.0)
LYMPH%: 29.5 % (ref 14.0–49.7)
MCH: 32.4 pg (ref 25.1–34.0)
MCHC: 34.3 g/dL (ref 31.5–36.0)
MONO#: 0.4 10*3/uL (ref 0.1–0.9)
Platelets: 261 10*3/uL (ref 145–400)
RBC: 4.04 10*6/uL (ref 3.70–5.45)
WBC: 5 10*3/uL (ref 3.9–10.3)
lymph#: 1.5 10*3/uL (ref 0.9–3.3)

## 2010-07-18 LAB — COMPREHENSIVE METABOLIC PANEL
ALT: 25 U/L (ref 0–35)
AST: 33 U/L (ref 0–37)
CO2: 30 mEq/L (ref 19–32)
Creatinine, Ser: 0.88 mg/dL (ref 0.40–1.20)
Sodium: 140 mEq/L (ref 135–145)
Total Bilirubin: 0.6 mg/dL (ref 0.3–1.2)
Total Protein: 7 g/dL (ref 6.0–8.3)

## 2010-07-24 LAB — BCR/ABL

## 2010-07-29 LAB — CREATININE, SERUM: GFR calc non Af Amer: >60 mL/min (ref >60)

## 2010-08-08 NOTE — Discharge Summary (Signed)
[name], RILING NO.:  [id number]  MEDICAL RECORD NO.:  [id number]          PATIENT TYPE:  INP  LOCATION:  1439                         FACILITY:  [facility]  PHYSICIAN:  Excell Seltzer. Annabell Howells, M.D.    DATE OF BIRTH:  1927-05-28  DATE OF ADMISSION:  05/27/2010 DATE OF DISCHARGE:  05/30/2010                              DISCHARGE SUMMARY   ADMISSION DIAGNOSES: 1. Obstructing right ureteral calculus. 2. Fever with urinary tract infection.  DISCHARGE DIAGNOSES: 1. Obstructing right ureteral calculus. 2. Fever with urinary tract infection.  PROCEDURES: 1. Cystoscopy. 2. Right retrograde pyelography. 3. Right ureteral stent placement (6 x 24).  HISTORY AND PHYSICAL:  For full details, please see admission history and physical.  Briefly, [name] is an 77 year old female with a history of urolithiasis who presented with a fever of 100 degrees Fahrenheit, a leukocytosis, and a urinary tract infection along with an obstructing right ureteral calculus.  It was recommended that she proceed to the operating room for right ureteral stent drainage and admission to the hospital for intravenous antibiotic therapy.  After careful consideration regarding management options for treatment, she did elect to proceed with surgical therapy in the above-named procedures.  HOSPITAL COURSE:  On May 28, 2010, she was taken to the operating room where she underwent the above-named procedures, which she tolerated well and without complications.  Postoperatively, she was able to be transferred to a regular hospital room following recovery from anesthesia.  She was found to be hemodynamically stable and her hemoglobin was within normal limits.  Her white count upon admission was 14.2, but had decreased to 11.5 after surgery.  We are bound to further decrease on postoperative day #1 to 9.5.  Her serum creatinine remained stable at 0.9 throughout hospitalization.  Postoperatively, she  was able to begin a clear liquid diet, which she was able to transition to regular diet over the course of the next 12-24 hours.  She was also able to begin ambulation.  On postoperative day #1, her urine culture had returned positive for E coli.  On postoperative day #2, she was ambulating without difficulty, her pain was well managed, and her white count had again been stable.  Therefore, she was felt stable for discharge home as she met all discharge criteria.  She was to follow up on June 08, 2010, for electroshock wave lithotripsy for removal of stone.  DISPOSITION:  Home.  DISCHARGE INSTRUCTIONS:  She was instructed to be ambulatory, but specifically told to refrain from any heavy lifting, strenuous activity, or driving while on pain medications.  She was instructed to resume her regular diet.  DISCHARGE MEDICATIONS:  She was instructed to resume all home medications consisting of Tylenol, her benzocaine ointment, Boniva, Gleevec, levothyroxine, metoprolol XL, and with multivitamins.  In addition, she was provided a prescription for Vicodin to take as needed for pain and told to use Pyridium 3 times daily as needed for burning. She was also given a prescription for Cipro to be taken for 1 week.  FOLLOWUP:  She will follow up on January 26th for outpatient shockwave lithotripsy for removal of  stone.     Delia Chimes, NP   ______________________________ Excell Seltzer. Annabell Howells, M.D.    MA/MEDQ  D:  08/03/2010  T:  08/04/2010  Job:  [id number]  Electronically Signed by Delia Chimes NP on 08/04/2010 09:51:04 AM Electronically Signed by Bjorn Pippin M.D. on 08/08/2010 11:21:16 AM

## 2010-09-20 ENCOUNTER — Encounter (HOSPITAL_COMMUNITY): Payer: BC Managed Care – PPO | Admitting: Psychiatry

## 2010-09-28 ENCOUNTER — Emergency Department (HOSPITAL_COMMUNITY)
Admission: EM | Admit: 2010-09-28 | Discharge: 2010-09-28 | Disposition: A | Discharge status: Home / Self Care | Payer: Medicare Other | Attending: Emergency Medicine | Admitting: Emergency Medicine

## 2010-09-28 ENCOUNTER — Emergency Department (HOSPITAL_COMMUNITY): Payer: Medicare Other

## 2010-09-28 DIAGNOSIS — F411 Generalized anxiety disorder: Secondary | ICD-10-CM | POA: Insufficient documentation

## 2010-09-28 DIAGNOSIS — E039 Hypothyroidism, unspecified: Secondary | ICD-10-CM | POA: Insufficient documentation

## 2010-09-28 DIAGNOSIS — Z85038 Personal history of other malignant neoplasm of large intestine: Secondary | ICD-10-CM | POA: Insufficient documentation

## 2010-09-28 DIAGNOSIS — I10 Essential (primary) hypertension: Secondary | ICD-10-CM | POA: Insufficient documentation

## 2010-09-28 DIAGNOSIS — R51 Headache: Secondary | ICD-10-CM | POA: Insufficient documentation

## 2010-09-28 LAB — URINALYSIS, ROUTINE W REFLEX MICROSCOPIC
Bilirubin Urine: NEGATIVE
Nitrite: NEGATIVE
Protein, ur: NEGATIVE mg/dL
Specific Gravity, Urine: 1.021 (ref 1.005–1.030)
Urobilinogen, UA: 0.2 mg/dL (ref 0.0–1.0)

## 2010-09-28 LAB — CBC
MCH: 31.3 pg (ref 26.0–34.0)
MCHC: 33.5 g/dL (ref 30.0–36.0)
MCV: 93.5 fL (ref 78.0–100.0)
Platelets: 188 10*3/uL (ref 150–400)
RDW: 12.7 % (ref 11.5–15.5)
WBC: 4.6 10*3/uL (ref 4.0–10.5)

## 2010-09-28 LAB — POCT I-STAT, CHEM 8
BUN: 7 mg/dL (ref 6–23)
Calcium, Ion: 1.1 mmol/L — ABNORMAL LOW (ref 1.12–1.32)
TCO2: 23 mmol/L (ref 0–100)

## 2010-09-29 NOTE — Consult Note (Signed)
NAMEMarland Wyatt  PAXTON, KANAAN                         ACCOUNT NO.:  [id number]   MEDICAL RECORD NO.:  [id number]                   PATIENT TYPE:  INP   LOCATION:  0344                                 FACILITY:  [facility]   PHYSICIAN:  Rose Phi. Myna Hidalgo, M.D.              DATE OF BIRTH:  Jul 23, 1927   DATE OF CONSULTATION:  06/28/2003  DATE OF DISCHARGE:                                   CONSULTATION   REFERRING PHYSICIAN:  Jackie Plum, M.D.   REASON FOR CONSULTATION:  Stage III colon cancer.   HISTORY OF PRESENT ILLNESS:  Ms. [name] is a 77 year old white female well  known to me.  She has history of chronic CML.  She was diagnosed with about  five or six years ago.  She is on Hydrea for this.   She recently was diagnosed with stage III colon cancer last year.  She  underwent surgical resection back in October.  She had a very difficult  postoperative recovery.  Her postoperative recovery was prolonged.  She had  two positive lymph nodes.  She has been receiving 5FU based chemotherapy.  She began weekly 5FU/leucovorin.  She has tolerated treatment fairly well.   She unfortunately has a history of significant psychiatric problems.  She  has been admitted over to [facility] on several occasions.  She  subsequently now is admitted with another exacerbation of confusion,  agitation, paranoia, et Karie Soda.   She has undergone a CT scan of the abdomen and pelvis.  This was negative  for any type of recurrent cancer.  She has significant diverticulosis.  She  has had melena.  She was on Coumadin at home for a portion.  For some  reason, her pro-time was markedly elevated with an INR of 12.6.  I suspect  that she was mis-taking her medications.  She did receive FFP to help  reverse the Coumadin.  She also received vitamin K.   She has not had any problems with recurrence of the CML.   She has had iron studies done, which show anemia of chronic disease.  There  may also be some degree of  iron deficiency anemia.  She has been seen by  Psychiatry.  She certainly is not of sound mind.  When I saw her this  morning, she knew who I was, but that was about the extent of the situation.  She really was not oriented to place or time.  She does not appear to be in  any kind of distress or pain.   PAST MEDICAL HISTORY:  1. CML.  2. Stage III colon cancer.  3. Hypothyroidism.  4. Depression.  5. Bipolar.   ALLERGIES:  STELAZINE.   MEDICATIONS:  1. Hydrea.  2. Wellbutrin.  3. Lisinopril.  4. Prilosec.  5. Trileptal.  6. Synthroid.  7. Paxil.  8. Klonopin.  9. Ambien.   SOCIAL HISTORY:  Negative for tobacco use  or alcohol use.   FAMILY HISTORY:  Noncontributory.   PHYSICAL EXAMINATION:  This a somewhat debilitated appearing white female in  no obvious distress.  VITAL SIGNS:  Temperature 98.4.  Pulse 92.  Respiratory 20.  Blood pressure  127/68.  HEAD AND NECK EXAM:  Shows a normocephalic, atraumatic skull.  She has no  ocular or oral lesions.  There is no scleral icterus.  She has no adenopathy  of the neck.  Thyroid is not palpable.  LUNGS:  Decreased breath sounds at the bases.  CARDIAC:  Regular rate and rhythm with a normal S1 and S2.  No gallops,  rubs, or bruits.  ABDOMEN:  Soft with good bowel sounds.  She has a healed laparotomy wound.  There is no palpable fluid wave.  There is no hepatosplenomegaly.  EXTREMITIES:  Show no clubbing, cyanosis, or edema.  SKIN:  No rashes, ecchymosis, or petechiae.  NEUROLOGIC:  Shows no focal neurologic deficits.   LABORATORY STUDIES:  White cell count of 9.3.  Hemoglobin 10.6.  Hematocrit  31.7.  Platelet count 372,000.  Sodium 136.  Potassium 5.0.  BUN 8.  Creatinine 0.7.  Glucose 173.  Calcium 8.2.  Her pro-time is 16.7 associated  with an INR of 1.5.  Ferritin is 133.  B12 is greater than 2000.   IMPRESSION:  [name] is a 77 year old female with stage III colon cancer.  She was on 5FU chemotherapy.  She had been  doing well with this.  She now  has another break with respect to her mental status.  This clearly is not  attributable to her chemotherapy.  She just is very unstable from a  psychological point of view.  I really question whether we need to continue  further chemotherapy on her.  I just am not convinced that we are going to  be getting huge benefit by continuing her on treatment.  She just is very  fragile and I would just hate to see her slip further into a mental state  that would require her to be treated over at [facility].   From my standpoint, I do not see any specific intervention that needs to be  made from my point of view.  I think we really need to ensure that she is  only taking 1 mg a day of Coumadin.  I would find it hard to believe that  she had a prothrombin time of almost 55 seconds by taking 1 mg of Coumadin a  day.   We will certainly be able to follow her along and make any recommendations.                                               Rose Phi. Myna Hidalgo, M.D.    PRE/MEDQ  D:  06/28/2003  T:  06/28/2003  Job:  [id number]   cc:   Jackie Plum, M.D.   Robert A. Nicholos Johns, M.D.  [address]  [place]  [place] [address]  Fax: [phone]   Adolph Pollack, M.D.  [address]  [place]  [place] [address]  Fax: [phone]

## 2010-09-29 NOTE — Discharge Summary (Signed)
NAMEMarland Kitchen  [name], [name]                         ACCOUNT NO.:  [id number]   MEDICAL RECORD NO.:  [id number]                   PATIENT TYPE:  INP   LOCATION:  0344                                 FACILITY:  [facility]   PHYSICIAN:  Hollice Espy, M.D.            DATE OF BIRTH:  19-Feb-1928   DATE OF ADMISSION:  06/24/2003  DATE OF DISCHARGE:  07/02/2003                                 DISCHARGE SUMMARY   CONSULTATIONS:  1. Adolph Pollack, M.D., surgery.  2. Rose Phi. Myna Hidalgo, M.D., oncology.  3. Antonietta Breach, M.D., psychiatry.   PRIMARY CARE PHYSICIAN:  Robert A. Nicholos Johns, M.D.   HISTORY OF PRESENT ILLNESS:  This is a 77 year old white female who is  status post right colectomy with Stage III colon cancer who had been  recently started on chemotherapy. The patient had a Port-A-Cath placed and  received 10 weeks of chemotherapy regimen. On the day of admission, she had  been having several problems. She had been reportedly recently more agitated  secondary to history of bipolar disorder, but she had been having frequent  loose stools that her husband described as black. When she was brought in  for an evaluation, she was noted to have potassium of 2.8 and INR of 12. The  patient had been Coumadin because of the Port-A-Cath. She was evaluated for  further evaluation and treatment. Vitamin K was given to reverse her INR as  well as FFP. Stool samples were sent off for pathogens, and her usual  medications were continued. The patient was continued on her DNR status.   HOSPITAL COURSE:  1. In regard to patient's high INR, her Coumadin was initially held. Her INR     slowly began to trend down and by July 01, 2003 was within normal     limits. At that time, surgery evaluated the patient and thought that she     should no longer be on Coumadin, and this was discontinued.  2. In regard to patient's history of bipolar disorder, Dr. Antonietta Breach     from psychiatry was consulted who  evaluated the patient. The patient     initially had been taking Wellbutrin p.o. t.i.d. 150 mg. This was changed     to twice a day, early in the morning and at 3 p.m. In addition, her Paxil     dose was decreased down to 12.5 mg from 25 mg. She had previously been on     Risperdal, and this was discontinued all together.  3. In regard to patient's history of colon cancer, Dr. Myna Hidalgo was consulted     because he had previously been following with this patient. She had been     on 5-FU chemotherapy and been doing well. It was felt that because of her     problems with her mental state it was more from a psychological point of     view, and  it was not felt to be attributed to her chemotherapy; however,     there was question of whether or not on Dr. Gustavo Lah behalf, that     patient would receive a huge benefit by continuing on treatment, and he     felt that holding off her on her weekly 5-FU/leucovorin will not help her     at this time. In addition, the patient also has a history of chronic     myelogenous leukemia for which she is receiving Hydrea 500 mg alternating     with 1 g daily. This medication was not continued during her hospital     stay, but now that she is more stable, we will go ahead and resume this.  4. In regards to patient's ileus, the patient developed an ileus felt to be     secondary to multifactorial cause including medication and electrolyte     abnormalities. She was made NPO. Dr. Abbey Chatters had been following her     for placement of her Port-A-Cath and had done resection of her colon     earlier and was consulted on June 28, 2003 when patient started     having abdominal pain and distention. She initially had been having poor     p.o. intake, and CT scan showed findings consistent with an ileus but no     evidence of further obstruction. Despite being NPO and put on IV Reglan,     the patient did continue to have an ileus persist, and on June 26, 2003, Dr. Abbey Chatters was consulted. He did agree that this was indeed a     multifactorial ileus from her low potassium, chemotherapy, and activity     and GI bleeding secondary to over anticoagulation as well as from     narcotics. He recommended decreasing her narcotics, continuing with her     antinausea medication, and he recommended giving her Reglan as well. The     patient tolerated this although she was having a great amount of     discharge. By July 01, 2003, the patient had a very, very large bowel     movement in the morning, her abdomen was less distended, and she was     complaining of less pain. A followup abdominal x-ray on July 01, 2003     showed a much improved ileus.  5. In regards to patient's malnutrition, the patient had apparently had been     having a long history of poor p.o. intake which was further worsened of     course by her ileus. It was felt that patient would best benefit from TNA     which was started on July 01, 2003. The patient tolerated this well,     and this will be continued during her rehab stay.   DISCHARGE MEDICATIONS:  1. Prinivil 10 mg p.o. daily.  2. Klonopin 1 mg p.o. q.h.s.  3. Wellbutrin 150 mg p.o. in the a.m. and 150 mg p.o. in the afternoon.  4. Synthroid 125 mcg p.o. daily.  5. Trileptal 300 mg p.o. b.i.d.  6. Protonix 40 mg p.o. daily.  7. Paxil 12.5 mg p.o. daily.  8. Hydrea 500 mg p.o. one day alternating with 1 g p.o. daily on every other     day.  9. In addition, the patient will receive Percocet 5/325 mg p.o. q.6h. p.r.n.   DISCHARGE DIAGNOSES:  1. Ileus.  2. Gastrointestinal bleed secondary to  over anticoagulation.  3. High INR of 12 on admission  4. Bipolar disorder.  5. Hypothyroidism.  6. Malnutrition.  7. Deconditioning.  8. History of Stage III colon cancer.  9. History of CML.  The patient will be discharged to the rehab center, specifically the [facility].   DIET:  She will continue on  the TPN for further nourishment.   DISPOSITION:  Her disposition is improved, and her activity will be dictated  as per the rehab center. Her long term prognosis will depend on whether  patient is able to restart p.o. and how well she does with rehab. Her lab  work on day of prior to discharge showed a prealbumin of 7.6, a sodium of  130, potassium 4, chloride 99, bicarb 27, BUN 6, creatinine 0.7, glucose  115, and an INR of 1.3.                                               Hollice Espy, M.D.    SKK/MEDQ  D:  07/02/2003  T:  07/02/2003  Job:  [id number]   cc:   Rose Phi. Myna Hidalgo, M.D.  [address] [facility]  [place], [place] [address]  Fax: [phone]   Antonietta Breach, M.D.  [address]  [place], [place] [address]  Fax: [phone]   Adolph Pollack, M.D.  [address]  [place]  [place] [address]  Fax: [phone]   Elana Alm. Nicholos Johns, M.D.  [address]  [place]  [place] [address]  Fax: [phone]

## 2010-09-29 NOTE — Op Note (Signed)
NAME:  LYNNEX, FULP                         ACCOUNT NO.:  [id number]   MEDICAL RECORD NO.:  [id number]                   PATIENT TYPE:  AMB   LOCATION:  DAY                                  FACILITY:  [facility]   PHYSICIAN:  Adolph Pollack, M.D.            DATE OF BIRTH:  1927/08/24   DATE OF PROCEDURE:  04/12/2003  DATE OF DISCHARGE:                                 OPERATIVE REPORT   PREOPERATIVE DIAGNOSIS:  Colon cancer.   POSTOPERATIVE DIAGNOSIS:  Colon cancer.   PROCEDURE:  Insertion of porta-cath with C-arm guidance.   SURGEON:  Adolph Pollack, M.D.   ANESTHESIA:  General.   INDICATIONS FOR PROCEDURE:  This 77 year old female is status post right  colectomy and was found to have stage 3 colon cancer. Chemotherapy has been  recommended to her and now she presents for long-term venous access.   DESCRIPTION OF PROCEDURE:  She was seen in the holding area and then brought  to the operating room. She was placed in the supine position on the  operating table and  intravenous sedation was given. The neck  and the upper  chest were all sterilely prepped and draped.   Local anesthesia was infiltrated in the infraclavicular region, and using  the 16 gauge needle, the left subclavian vein was cannulated. A wire was  passed through the needle  into the vein and  into the superior vena cava  under fluoroscopic guidance. Following  this a larger incision was made  around the wire and the dilator inducer complex was also inserted until the  tip of the dilator was in the superior vena cava.   I then anesthetized an area in the chest wall further inferior to the left  clavicle and a larger incision was made through the skin and subcutaneous  tissue, and using blunt dissection and cautery a pocket was created for the  porta-cath. The catheter portion was then passed from the  inferior  to the  superior incision. The dilator and the wire were removed and the catheter  was threaded  through the peel-away introducer in the right heart.   Under fluoroscopic guidance the catheter was pulled back until its tip was  in the superior vena cava right atrial junction. The catheter was then cut  and threaded on to the port. The port easily withdrew blood and flushed. I  then anchored the port to the chest wall with interrupted 2-0 Vicryl  sutures.   Following this, using the C-arm, the position of the catheter port was  reverified. I then closed the subcutaneous tissue over the port with a  running 3-0 Monocryl suture. The skin incisions were then closed with  running 3-0 Monocryl sutures. I introduced a Huber needle  into the port and  that withdrew blood. I then flushed it and placed concentrated heparin into  the port. I then capped the [facility] needle off. Steri-Strips were  then placed  on both wounds followed  by sterile dressings.   She tolerated the procedure well without any apparent complications. She was  taken to the recovery room in satisfactory condition where a portable chest  x-ray is pending  at this time.                                               Adolph Pollack, M.D.    Kari Baars  D:  04/12/2003  T:  04/12/2003  Job:  [id number]

## 2010-09-29 NOTE — Op Note (Signed)
NAME:  [name], [name]                         ACCOUNT NO.:  [id number]   MEDICAL RECORD NO.:  [id number]                   PATIENT TYPE:  AMB   LOCATION:  ENDO                                 FACILITY:  [facility]   PHYSICIAN:  James L. Malon Kindle., M.D.          DATE OF BIRTH:  1927-05-26   DATE OF PROCEDURE:  01/24/2004  DATE OF DISCHARGE:                                 OPERATIVE REPORT   PROCEDURE:  Colonoscopy.   MEDICATIONS:  Fentanyl 125 mcg, Versed 14 mg IV.   INDICATIONS:  The patient had a right hemicolectomy for colon cancer last  October.  She comes in now for a follow-up colonoscopy.  She has received  chemotherapy.   DESCRIPTION OF PROCEDURE:  The procedure had been explained to the patient  and consent obtained.  With the patient in the left lateral decubitus  position, the Olympus pediatric adjustable scope was inserted and advanced.  The patient had severe diverticular disease.  Several maneuvers were  required to pass the sigmoid colon.  Once we passed the sigmoid colon, we  were able to advance easily to the anastomosis.  The small bowel was entered  for a distance of 10 cm, was normal.  The anastomosis was normal.  The scope  was withdrawn and the transverse colon, splenic flexure, descending and  sigmoid colon were unremarkable with no polyps or other lesion other than  extensive diverticular disease of the sigmoid colon.  The rectum was free of  polyps.  The scope was withdrawn.  The patient tolerated the procedure well.   ASSESSMENT:  1.  History of colon cancer with negative colonoscopy, V10.50.  2.  Marked diverticulosis.   PLAN:  Will recommend yearly Hemoccults.  Would recommend repeating this  procedure in three years.                                               James L. Malon Kindle., M.D.    Waldron Session  D:  01/24/2004  T:  01/24/2004  Job:  [id number]   cc:   Molly Maduro A. Nicholos Johns, M.D.  [address]  [place]  [place] [address]  Fax: [phone]   Rose Phi. Myna Hidalgo, M.D.  [address] [facility]  [place], [place] [address]  Fax: [phone]   Adolph Pollack, M.D.  [address]  [place]  [place] [address]  Fax: [phone]   Gretta Cool, M.D.  [address] [place]  [place] [address]  Fax: [phone]

## 2010-09-29 NOTE — H&P (Signed)
NAME:  [name], [name]                         ACCOUNT NO.:  [id number]   MEDICAL RECORD NO.:  [id number]                   PATIENT TYPE:  INP   LOCATION:  E454                                 FACILITY:  [id number]   PHYSICIAN:  Adolph Pollack, M.D.            DATE OF BIRTH:  10-03-1927   DATE OF ADMISSION:  02/23/2003  DATE OF DISCHARGE:                                HISTORY & PHYSICAL   REASON FOR ADMISSION:  Elective operation.   HISTORY OF PRESENT ILLNESS:  [name] is a 77 year old female who has  known cholelithiasis and gallbladder disease.  She had a bout of acute  diverticulitis that resolved fairly rapidly with outpatient antibiotic  treatment.  I had asked [name], M.D. to perform colonoscopy on her  which demonstrated diverticulosis, but also an ulcerated ascending colon  lesion with biopsy positive for cancer.  She is admitted now for elective  laparoscopic cholecystectomy and right colectomy.   PAST MEDICAL HISTORY:  1. Cardiomyopathy.  2. Hypertension.  3. Leukemia.  4. Hypothyroidism.  5. Bipolar disease.  6. Gastroesophageal reflux disease.   PAST SURGICAL HISTORY:  1. Hysterectomy.  2. Right and left hip replacements at separate times.   ALLERGIES:  STELAZINE.   MEDICATIONS:  1. Hydroxyurea.  2. Synthroid.  3. Prinivil.  4. Prilosec.  5. Wellbutrin.  6. Paxil CR.  7. Trileptal.  8. Klonopin.  9. Risperdal.  10.      Ambien p.r.n.  11.      Hydrocodone/homatropine p.r.n. cough.   SOCIAL HISTORY:  She is married.  No tobacco or alcohol use.   FAMILY HISTORY:  Positive for tuberculosis in her father, congestive heart  failure in her mother.   PHYSICAL EXAMINATION:  GENERAL:  Elderly female who is well-nourished, in no  acute distress with somewhat of a flat affect.  HEENT:  Eyes:  Extraocular movements are intact.  No icterus noted.  NECK:  Supple without palpable masses.  RESPIRATORY:  Breath sounds equal and clear.  Respirations  unlabored.  CARDIOVASCULAR:  Heart demonstrates a regular rate and rhythm.  ABDOMEN:  Soft, nontender, nondistended.  No palpable masses are present.  A  lower transverse scar noted without evidence of hernia.  EXTREMITIES:  No cyanosis or edema noted.   IMPRESSION:  1. Cholelithiasis.  2. Right colon cancer.  3. Bipolar disorder.  4. Cardiomyopathy.  5. Hypertension.  6. Gastroesophageal reflux disease.   PLAN:  Laparoscopic cholecystectomy and right colectomy.  The procedure,  rationale, and risks were discussed with her preoperatively.                                               Adolph Pollack, M.D.    [name]  D:  02/23/2003  T:  02/23/2003  Job:  [id number]  

## 2010-09-29 NOTE — Discharge Summary (Signed)
[place]  Patient:    BRANDII, LAKEY                      MRN: [id number] Adm. Date:  [id number] Disc. Date: [id number] Attending:  Marlowe Kays Page Dictator:   Dorie Rank, P.A. CC:         Runell Gess, M.D.   Discharge Summary  ADMISSION DIAGNOSES: 1. End-stage osteoarthritis of the left hip. 2. Hypertension. 3. Gastroesophageal reflux disease. 4. Hypothyroidism with goiter. 5. Clinical depression/anxiety. 6. Chronic myelocytic anemia. 7. Left bundle branch block.  DISCHARGE DIAGNOSES:  1. Osteoarthritis to the left hip status post left total hip arthroplasty.  2. Hypokalemia, resolved.  3. Transient hypotension felt to be due to narcotics, stable.  4. Hypertension.  5. Gastroesophageal reflux disease.  6. Hypothyroidism with goiter.  7. Clinical depression/anxiety.  8. Chronic myelocytic anemia.  9. Left bundle branch block. 10. Hypokalemia, resolved.  HISTORY OF PRESENT ILLNESS:  Ms. Trillo is a 77 year old female who has had progressive problems with her left hip.  It was noted to be increasing in frequency and duration.  It was to a point that it was interfering with her activities of daily living.  X-rays in the office revealed severe osteoarthritis to the left hip.  It was felt she would benefit from undergoing a right total hip arthroplasty to the left.  The risks and benefits as well as the procedure were discussed with the patient in the office, then her and her family decided to proceed.  PROCEDURE:  On November 25, 2000 patient underwent a [employer] left total hip arthroplasty.  Surgeon was [employer] [place], M.D.  Assistants were [employer]. Montez Morita, M.D., Ralene Bathe, P.A-C.  Anesthesia was general. Complications:  None.  CONSULTS:  Cardiology-Dr. Allyson Sabal.  Rehabilitation consult was obtained for possible stay.  HOSPITAL COURSE:  Patient was admitted on November 25, 2000 for the above stated procedure.  She  tolerated surgery well.  She did require 1 unit of autologous blood while in the operating room due to blood loss.  The patient was placed on Coumadin postoperatively for DVT prophylaxis.  She utilized ted hose as well bilaterally.  An abduction pillow was used between the legs and the straps were kept loose throughout her hospital stay.  She was placed on a 25% partial weightbearing activity status to the left lower extremity.  Her home medications were resumed.  Initially, PCA morphine was utilized with Percocet for breakthrough.  IV Ancef was used for 24 hours for postoperative infection control.  Tylenol was utilized for any fevers.  She utilized a bed side incentive spirometer while in the hospital.  She utilized oxygen 2 L via nasal cannula for the first 24 hours.  While in the operating room her wound was dressed in a sterile fashion.  On postoperative day #1 the wound was found to be clean, dry, and intact.  Her dressing was changed on postoperative day #2 and every day thereafter and found to be free of any erythema and minimal serous drainage.  The patient was transferred to telemetry as she did have a previous cardiac history as noted above.  On postoperative day #1 she was found to be hypotensive.  The PCA morphine was stopped and she utilized Percocet throughout the remainder of her hospital stay.  A fluid bolus of 250 cc was given to increase her blood pressure and cardiology consult was obtained.  It was felt that her  transient hypotension was due to narcotic use. Hemoglobin and hematocrit were obtained daily x 5 days.  On November 26, 2000 it was found to be 8.8 and she was transfused 2 units of blood.  This did bring it up to 10.3 on November 27, 2000.  Her hemoglobin was stable throughout the remainder of her hospital stay.  She was placed on trinsicon.  The pharmacy monitored and dosed her Coumadin therapy throughout the remainder of her hospital stay.  On November 26, 2000 patients  potassium was 3.3 and she was treated with K-Dur which brought it back up to 3.6 on November 27, 2000.  Physical therapy worked with the patient on ambulation.  She was somewhat slow to progress with physical therapy secondary to her transient hypotension; however, she was progressing well with physical therapy prior to her discharge.  Her home medications were resumed.  Her antihypertensives were held and her systolic blood pressure was less than 100.  A Foley catheter was placed while in the operating room.  This was discontinued on November 27, 2000 and she was found to be voiding well throughout the remainder of her hospital stay.  She initially utilized an IV postoperatively; however, on postoperative day #2 she was found to be taking p.o. foods and fluids well.  Discharge planning worked with the patient for home health needs, durable medical equipment, and setting up home health physical therapy.  On December 02, 2000 she was felt to be stable medically and orthopedically for discharge.  LABORATORIES:  Blood gas on November 25, 2000 revealed a pH of 7.332.  Otherwise within normal limits.  On November 20, 2000 white blood count was 14.4, RBC 3.24, hemoglobin 11.8, hematocrit 35.0, MCV 108.1.  On November 26, 2000 hemoglobin was 8.8, hematocrit 25.3, MCV 106.6.  On November 29, 2000 hemoglobin 8.9, hematocrit 25.5.  PT/INR on November 21, 1999 were 13.7, 1.1, 28 respectively.  On November 28, 2000 PT was 22.6, INR 2.5.  On November 30, 2000 PT was 21.4, INR 2.3.  On December 01, 2000 PT was 21.4, INR 2.3.  BMET on November 25, 2000 was within normal limits other than a glucose of 165.  BMET on November 26, 2000 revealed sodium 133, potassium 3.3, glucose 154.  On November 27, 2000 glucose was 233, calcium 2.8. On November 30, 2000 BMET was within normal limits other than a glucose of 117, BUN 5.  On November 20, 2000 ALT was 13, ALP 71, TBIL 0.5.  UA on November 20, 2000 revealed moderate leukocyte esterase.  On November 30, 2000 urinalysis revealed 1.0  urobilinogen, otherwise negative.  X-rays:  One view left hip x-ray on November 25, 2000 revealed satisfactory  femoral prosthesis.  One view pelvis x-ray on November 25, 2000 revealed satisfactory position of the acetabular cup.  The medial screw extended into the pelvis.  EKG from [facility] on November 18, 2000 revealed T-wave inverted in I and AVL which was new when compared to old tracings.  CONDITION ON DISCHARGE:  Stable.  PLAN:  Patient was discharged home with home health PT and home health R.N. for Coumadin therapy and physical therapy through ______.  Regular diet.  She was discharged with prescriptions for trinsicon, Percocet, Robaxin, and Coumadin per pharmacy.  She was 25% partial weightbearing to the left lower extremity with a walker.  Daily dressing changes to her wound.  She could shower with assistance following discharge.  She was to follow-up with Dr. Simonne Come in two weeks from  the date of her surgery.  Call for an appointment. Tylenol for fever, if greater than 101 call the office.  She was to continue her home medications.  Call the office should she have any questions or concerns prior to her appointment. DD:  12/20/00 TD:  12/21/00 Job: [id number]

## 2010-09-29 NOTE — Op Note (Signed)
NAME:  Andrea, Wyatt                         ACCOUNT NO.:  [id number]   MEDICAL RECORD NO.:  [id number]                   PATIENT TYPE:  INP   LOCATION:  N629                                 FACILITY:  [facility]   PHYSICIAN:  Adolph Pollack, M.D.            DATE OF BIRTH:  10/07/1927   DATE OF PROCEDURE:  02/23/2003  DATE OF DISCHARGE:                                 OPERATIVE REPORT   PREOPERATIVE DIAGNOSES:  1. Right colon cancer.  2. Cholelithiasis.   POSTOPERATIVE DIAGNOSES:  1. Right colon cancer.  2. Cholelithiasis.   PROCEDURES:  1. Laparoscopic cholecystectomy with intraoperative cholangiogram.  2. Laparoscopic right colectomy.   SURGEON:  Adolph Pollack, M.D.   ASSISTANT:  Rose Phi. Maple Hudson, M.D.   ANESTHESIA:  General.   INDICATIONS:  [name] is a 77 year old white female with known  gallbladder disease, who was recently discovered to have a right colon  cancer after undergoing colonoscopy following a bout of acute  diverticulitis.  She was admitted to the hospital the same day for the above  procedures.   TECHNIQUE:  She is seen in the holding area, then brought to the operating  room and placed supine on the operating table, and a general anesthetic was  administered.  A Foley catheter was placed in her bladder.  Her abdominal  wall was sterilely prepped and draped.  A small subumbilical incision was  made incising the skin and subcutaneous tissue sharply until the midline  fascia was identified and a small incision made in the midline fascia.  The  peritoneal cavity was entered bluntly and under direct vision a pursestring  suture of 0 Vicryl was placed around the fascial edges.  A Hasson trocar was  introduced into the peritoneal cavity and a pneumoperitoneum created by  insufflation of CO2 gas.   Next the laparoscope is introduced and she is placed in the reverse  Trendelenburg position with the right side tilted slightly upward.  Under  direct  vision a 10 mm trocar is placed just to the left of the midline in  the epigastric region and two 5 mm trocars were placed in the right  midabdomen.  Omental adhesions to the fundus of the gallbladder were noted  and taken down bluntly and with cautery.  The fundus was then grasped and  more omental adhesions were taken down bluntly.  The fundus was then able to  be retracted toward the right shoulder.  The infundibulum was grasped and  mobilized using blunt dissection.  The cystic duct was identified and a  window was created around it.  A clip was placed just above the cystic duct-  gallbladder junction and a small incision was made in the cystic duct.  A  cholangiocatheter was passed through the anterior abdominal wall, placed  into the cystic duct, and a cholangiogram was performed.   Using real-time fluoroscopy, dilute contrast material was injected  into the  cystic duct.  She has a moderate-length cystic duct.  The common hepatic,  right and left hepatic, and common bile ducts all filled promptly.  The  common bile duct drained contrast promptly into the duodenum without  evidence of obstruction.  Final report is pending the radiologist's  interpretation.   The cholangiocatheter was removed, and the cystic duct was clipped three  times on the staying in side, and then divided.  Both an anterior and  posterior branch of the cystic artery were identified, isolated, clipped,  and divided.  The gallbladder was then dissected free from the liver bed  intact using the cautery.  I saw what appeared to be a small accessory bile  duct coming straight out of the liver into the gallbladder, and I clipped  this twice and then also cauterized it.  I then placed the gallbladder in an  Endopouch bag.  I removed the gallbladder through the subumbilical port and  then replaced the Hasson trocar.   I then copiously irrigated the perihepatic area.  The gallbladder fossa was  inspected and bleeding  points were controlled with the cautery.  Once  hemostasis was adequate, I inspected the area again and noticed no bile leak  or bleeding.   At this point I directed my attention to the right colon.  I placed a 5 mm  trocar in the lower midline.  I grasped the cecum using the Harmonic  scalpel.  I divided the white line of Toldt and the peritoneal attachments  of the right colon laterally.  I used blunt dissection to gently medialize  the right colon and the distal ileum.  I kept my plane of dissection above  the ureter.  I then divided the attachments to the hepatic flexure and using  the Harmonic scalpel was able to mobilized the proximal transverse colon.  At this time I removed the two right midabdominal 5 mm ports and connected  the skin incision as well as the muscle and fascial incisions and entered  the peritoneal cavity.  I placed a wound protector in, and this served as my  extraction site.  I grasped the cecum and pulled the cecum, distal ileum,  right colon, and proximal transverse colon through this aperture.  I then  divided the ileum proximal to the ileocecal valve with a GIA stapler and  then divided the transverse colon just proximal to the middle colic vessels  with the GIA stapler.  The mesenteric vessels were then divided between  clamps and ligated and a wedge of mesentery was included with the specimen,  which was handed off the field.  The tumor was easily palpable.   Next a side-to-side stapled anastomosis was performed.  The remaining  enterotomy was closed with a linear noncutting stapler.  Gloves were then  changed.  A stitch was placed at the distalmost staple line to minimize  tension on it.  The anastomosis was patent, viable, and under no tension.  I  then dropped the anastomosis back into the abdominal cavity.   Next 2 L of warm saline were used to irrigate the abdominal cavity.  No obvious bleeding was noted.  Once I had evacuated as much fluid as  possible,  I then closed the posterior fascial layer of the extraction incision after  removing the wound protector with a running #1 PDS suture.  The anterior  fascial layer was also closed with a running #1 PDS suture.  The  subcutaneous tissue  was irrigated and the skin was closed with staples.   Following this I reinsufflated the abdomen and inspected the area.  The  closure of the extraction incision was solid.  There was minimal irrigation  fluid remaining, and I evacuated this.  No bleeding was noted.  The  anastomosis appeared to continue viable.  I then removed the remaining  trocars and released the pneumoperitoneum after closing the subumbilical  fascial defect under laparoscopic vision by tightening up and tying down the  pursestring suture.  These remaining incisions were then stapled.  Sterile  dressings were applied.   She tolerated the procedure well without any apparent complications and was  subsequently taken to the recovery room in satisfactory condition.                                               Adolph Pollack, M.D.    Kari Baars  D:  02/23/2003  T:  02/23/2003  Job:  [id number]   cc:   Gretta Cool, M.D.  [address] [place]  [place] [address]  Fax: [phone]   Elana Alm. Nicholos Johns, M.D.  [address]  [place]  [place] [address]  Fax: [phone]   Rose Phi. Myna Hidalgo, M.D.  [address]  [place], [place] [address]  Fax: [phone]   Llana Aliment. Malon Kindle., M.D.  [address]  [place]  [place] [address]  Fax: [phone]

## 2010-09-29 NOTE — Op Note (Signed)
[facility]  Patient:    [name], [name]                      MRN: [id number] Proc. Date: 11/25/00 Adm. Date:  [id number] Attending:  Marlowe Kays Page                           Operative Report  PREOPERATIVE DIAGNOSIS:  Osteoarthritis, left hip.  POSTOPERATIVE DIAGNOSIS:  Osteoarthritis, left hip.  OPERATION PERFORMED:  Osteonics total hip arthroplasty, left.  SURGEON:  Illene Labrador. Aplington, M.D.  ASSISTANT: 1. Philips J. Montez Morita, M.D. 2. Ralene Bathe, P.A.  ANESTHESIA:  General.  INDICATIONS FOR PROCEDURE:  Advanced osteoarthritis.  She had a total on the right doing well after many years.  DESCRIPTION OF PROCEDURE:  Prophylactic antibiotics, satisfactory general anesthesia, Foley catheter inserted.  Right lateral decubitus position on the Waverly II frame.  Left hip was prepped with DuraPrep and draped in a sterile field.  Ioban employed.  Posterolateral incision down to the fascia lata. Zickles band was cut.  The gluteus medius was partially detached from the femur and tagged with #1 Ethibond.  External rotators were cut off the femur with cutting cautery.  The hip capsule was identified and partial capsulectomy performed.  The hip was dislocated and the femoral neck cut just below the femoral head with a power saw.  We then cleared the piriformis fossa of soft tissue and placed a guide pin down it followed by step cut drill and canal finder.  I then began the vertical reaming process.  Preoperative template indicating 7 or 8 size hip.  I first did a vertical reaming up to a size 7 and along the way used the greater trochanteric reamer and also made my final cut of the femoral neck a fingerbreadth above the lesser trochanter.  Her preoperative leg lengths were about the same so we wanted simply to put in the best tension hip we could.  I began the broaching procedure and found that I could go beyond the seven.  Consequently, I returned  to the cylindrical reamers and reamed up to 8 and then went back and broached to an 8 which seemed to be an appropriate size.  I then performed additional capsulectomy until the hip socket was well identified.  The soft tissue was removed from the ____________ the acetabulum and I then began deepening and expanding reaming process going up to a 56.  I then went to a trial reduction with a trial cup and the hip seemed to be stable and consequently went ahead and placed a #56 PSL cup, tightly impacting it and then using two screws which were individually drilled, measured and screwed.  I went through another trial reduction with a 10 degree liner at about 12 oclock and the hip was nicely stable in all directions with this.  Consequently, I went ahead and put the final 10 degree polyethylene liner at this position and then returned to the femur where we measured for the bone plug at a #3 and this was placed.  We then checked it with a broach to be sure that we had adequate room for the final prosthesis.  We then waterpicked, dried and placed an Adrenalin sponge in the canal while the methyl methacrylate was being mixed.  After once again drying the canal, I inserted the methyl methacrylate with a glue gun and glove technique followed by the  final Omnifit plus #8 cemented stem which I tightly impacted and held while excess methyl methacrylate was trimmed up from around the base.  I then checked to make sure there was no excess methyl methacrylate in the soft tissues after it hardened and went through another trial reduction of the hip and this time with a +5 neck which seemed to be the ideal length. The hip was very stable with no toggle and was not too tight.  Consequently, I elected to go with the +5 C-tapered head which was placed, hip reduced and once again was found to be stable.  The wound was then irrigated well with sterile saline as it had been throughout the case.  The gluteus medius  and external  rotators were reattached with #1 Ethibond through drill holes. Zickles band was reapproximated with #1 Vicryl, the fascia lata with interrupted #1 Vicryl.  Deep subcutaneous tissues with #1 followed by 0 and 2-0 Vicryl and skin with staples.  Betadine Adaptic dry sterile dressing were applied.  She was gently placed on her back.  Her toes were up on her left foot and leg lengths appeared to be about equal.  She was placed in the abduction pillow and taken to the recovery room in satisfactory condition with no known complications.  Estimated blood loss was 900 cc.  Anesthesiologist wanted to hold off any blood replacement until she got to the recovery room to see about her cardiovascular stability since she had heart problems in her preoperative medical status. DD:  11/25/00 TD:  11/25/00 Job: [id number]

## 2010-09-29 NOTE — H&P (Signed)
NAME:  [name], [name]                         ACCOUNT NO.:  [id number]   MEDICAL RECORD NO.:  [id number]                   PATIENT TYPE:  EMS   LOCATION:  ED                                   FACILITY:  [facility]   PHYSICIAN:  Sherin Quarry, MD                   DATE OF BIRTH:  [birth year]   DATE OF ADMISSION:  06/24/2003  DATE OF DISCHARGE:                                HISTORY & PHYSICAL   HISTORY:  [name] is a 77 year old lady who is status post right  colectomy performed by Dr. Abbey Chatters and was found to have evidence of  stage III colon cancer.  She has been started on chemotherapy.  Dr.  Abbey Chatters place a Port-A-Cath, and, according to her husband, she has  received 10 weeks of a chemotherapy regimen.  The husband indicates tonight  that she has been having several problems.  First, she has been more  agitated than usual.  She has bipolar disorder, but she has been calling out  frequently, and it has been interfering with his ability to sleep.  Because  of his concern about her agitation, he recently brought her to see her  psychiatrist, who reduced her Klonopin, and told her to stop taking  Risperdal.  Her other medications are as outlined below.  Secondly, she has  been having frequent loose stools that her husband describes as black.  Finally, on presentation to the [facility] emergency room, laboratory  studies were remarkable for a potassium of 2.8, and for an INR of 12.  The  patient has been on Coumadin during the period of time that she has been on  chemotherapy.  Her husband indicates that her prothrombin time was measured  a week ago.  He believes that she is taking 5 mg of Coumadin daily.   ALLERGIES:  STELAZINE.   CURRENT MEDICATIONS:  1. Hydroxyurea 500 mg alternating with 1 gm daily.  2. Wellbutrin 150 mg t.i.d.  3. Lisinopril 10 mg daily.  4. Albuterol p.r.n.  5. Prilosec 20 mg daily.  6. Trileptal 300 mg b.i.d.  7. Synthroid 125 mcg daily.  8.  Paxil CR 25 mg daily.  9. Klonopin, possibly 1 mg b.i.d.  10.      Tylenol p.r.n.  (As noted previously, she has been taken off of Risperdal.)   OPERATIONS:  As previously mentioned, she has had a colon resection for a  stage III colon cancer.  She has had a previous hysterectomy, and has also  had a right and left total hip replacement.   CURRENT MEDICAL ILLNESSES:  1. Bipolar disorder.  2. Gastroesophageal reflux.  3. Congestive heart failure.   FAMILY HISTORY:  Noncontributory.   SOCIAL HISTORY:  The patient lives with her husband, who is retired.  She  does not have any history of alcohol or tobacco abuse.  It has been very  difficult to  him to care for her.  Lately, she has required his assistance  to just get from the bed to a chair, and has been quite agitated and  intermittently confused.   REVIEW OF SYSTEMS:  HEAD:  She denies headache or dizziness.  EYES:  She  denies visual blurring or diplopia.  EARS, NOSE, THROAT:  Denies earache,  sinus pain, or sore throat.  CHEST:  Denies coughing, wheezing, or chest  congestion.  CARDIOVASCULAR:  Denies orthopnea, PND, or ankle edema.  GI:  See above.  GU:  There have been no problems with dysuria.  I believe that  she has been intermittently incontinent.  NEUROLOGIC:  She denies a history  of seizure or stroke.  ENDOCRINE:  There is no history of excessive thirst,  urinary frequency, or nocturia.   PHYSICAL EXAMINATION:  VITAL SIGNS:  Blood pressure is 125/52, pulse 90,  respirations 18, O2 saturation 100%.  HEENT:  Within normal limits.  CHEST:  Clear to auscultation and percussion.  BACK:  No CVA or point tenderness.  CARDIOVASCULAR:  Normal S1 and S2 without rubs, murmurs, or gallops.  ABDOMEN:  Remarkable for hypoactive bowel sounds.  They are not high pitched  or tinkling.  The abdomen is slightly distended.  It appears to be  moderately tender to palpation.  NEUROLOGIC:  Examination of the extremities is normal.    IMPRESSION:  1. Worsening confusion.  2. Increased diarrhea with fecal incontinence.  3. Very prolonged INR on Coumadin therapy.  4. Cardiomyopathy.  5. Hypertension.  6. History of leukemia.  7. History of hypothyroidism.  8. History of colon cancer.  Chemotherapy ongoing.  Rated at stage III.  9. Bipolar disorder.  10.      Gastroesophageal reflux.  11.      Status post total hip replacement.  12.      Hypokalemia.   PLAN:  This patient will be admitted to the hospital for evaluation and  treatment.  We will reverse the INR with vitamin K, and I will give fresh  frozen plasma x2 units.  Will follow this very closely.  Stool specimens  will be obtained for Clostridium difficile, blood, ovum and parasites, and  enteric pathogens.  Her usual medications will be continued.  Dr. Myna Hidalgo  should be involved in her management.  The patient desires a DNR status, per  both the husband and per a living will.                                               Sherin Quarry, MD    SY/MEDQ  D:  06/24/2003  T:  06/25/2003  Job:  [id number]   cc:   Molly Maduro A. Nicholos Johns, M.D.  [address]  [place]  [place] [address]  Fax: [phone]   Rose Phi. Myna Hidalgo, M.D.  [address]  [place], [place] [address]  Fax: [phone]   Llana Aliment. Malon Kindle., M.D.  [address]  [place]  [place] [address]  Fax: [phone]   Adolph Pollack, M.D.  [address], [address]  [place]  [place] [address]  Fax: [phone]

## 2010-09-29 NOTE — Op Note (Signed)
NAMERICHARDINE, PEPPERS NO.:  [id number]   MEDICAL RECORD NO.:  [id number]          PATIENT TYPE:  AMB   LOCATION:  DSC                          FACILITY:  [facility]   PHYSICIAN:  Adolph Pollack, M.D.DATE OF BIRTH:  06/04/1927   DATE OF PROCEDURE:  02/01/2004  DATE OF DISCHARGE:                                 OPERATIVE REPORT   PREOPERATIVE DIAGNOSIS:  Retained Port-A-Cath.   POSTOPERATIVE DIAGNOSIS:  Retained Port-A-Cath.   PROCEDURE:  Port-A-Cath removal.   SURGEON:  Adolph Pollack, M.D.   ANESTHESIA:  Local (1% lidocaine with epinephrine plus 0.5% plain Marcaine  plus sodium bicarbonate) with MAC.   INDICATIONS FOR PROCEDURE:  Ms. Vasudevan is a 77 year old female with stage  III colon cancer.  She has completed  her chemotherapy.  It is felt that she  can have her Port-A-Cath removed and she presents for that.  The procedure,  the risks including, but not limited to bleeding and infection were  explained to her and her husband.   DESCRIPTION OF PROCEDURE:  She was seen in the holding area and brought to  the operating room, placed supine on the operating table and given  intravenous sedation.  The left upper chest wall was sterilely prepped and  draped.  Local anesthetic was infiltrated superficially over the Port-A-Cath  and deep.  The previous incision was reincised down to the fibrous capsule  with Port-A-Cath which was incised.  The catheter was then identified and  removed from the track.  Direct pressure was held in the left  infraclavicular region.  The Port-A-Cath was then dissected free from the  chest wall with sharp dissection and removed.  Bleeding points were  controlled with the cautery.   Once hemostasis was adequate, I then closed the wound in two layers with  running 4-0 Vicryl for the subcutaneous tissue and a 4-0 Monocryl  subcuticular stitch for the skin.  Steri-Strips and sterile dressings were  applied.   She tolerated the  procedure without any apparent complications and was  subsequently taken to the recovery room in satisfactory condition.  She will  be given postoperative instructions.  She can stop her Coumadin now.  She  will come back and see me in seven to 10 days for wound check.       TJR/MEDQ  D:  02/01/2004  T:  02/02/2004  Job:  [id number]

## 2010-09-29 NOTE — H&P (Signed)
[name], IGE NO.:  [id number]   MEDICAL RECORD NO.:  [id number]          PATIENT TYPE:  INP   LOCATION:  3730                         FACILITY:  [name]   PHYSICIAN:  [name], M.D.   DATE OF BIRTH:  12-24-1927   DATE OF ADMISSION:  11/24/2004  DATE OF DISCHARGE:                                HISTORY & PHYSICAL   HISTORY OF PRESENT ILLNESS:  The patient is a 77 year old white female with  a past medical history significant for colon cancer, leukemia, bipolar,  hypothyroidism. The patient was brought via EMS to [name] secondary to seizure episode. Per husband, they were  sitting down approximately three hours ago watching T.V. when the patient  developed a tonic posture and shaking with foaming at the mouth and jerking  movements. She lost control of her bladder and bowel function. She had no  prior symptoms of cough, fever, or chills. She did, however, miss her  medications x1 day. The patient had a similar episode witnessed about four  years ago but she did not lose control of bladder and bowel at the time.   While being transported by EMS to [name], the patient had another episode of seizure resolved with Valium 10 mg.  She was postictal status post seizures.   PAST MEDICAL HISTORY:  1.  Significant for leukemia.  2.  Hypertension.  3.  Hypothyroidism.  4.  History of colon cancer, stage III status post chemotherapy.  5.  Bipolar disorder.  6.  GERD.   PAST SURGICAL HISTORY:  Status post total hip replacement.   FAMILY HISTORY:  Noncontributory.   SOCIAL HISTORY:  She is married with one son. Retired. She does not smoke or  drink.   MEDICATIONS:  1.  She takes hydroxyurea 500 two on Monday, Wednesday, Friday, and Sunday      and one on Tuesday and Thursday.  2.  Wellbutrin 150 mg t.i.d.  3.  Lisinopril 10 mg daily.  4.  Trileptal 300 mg b.i.d.  5.  Synthroid  125 mcg daily.  6.  Paxil 20 mg at bed time.  7.  Klonopin 1 mg 1/2 in the morning, 1/2 at noon, and 1/2 at p.m.   ALLERGIES:  STELAZINE.   REVIEW OF SYSTEMS:  As per stated in the HPI.   PHYSICAL EXAMINATION:  VITAL SIGNS:  Temperature 98, blood pressure 134/52,  pulse of 91, respirations 24. Pulse oximeter of 100% on room air.  HEENT:  Normocephalic and atraumatic. Pupils are equal, round, and reactive  to light. Throat has edema.  CARDIOVASCULAR:  Regular rate and rhythm. No murmurs, gallops, or rubs.  LUNGS:  Clear to auscultation bilaterally. No wheezes, rhonchi, or rales.  ABDOMEN:  Soft, nontender, and nondistended. Positive bowel sounds.  EXTREMITIES:  Without edema.  NEUROLOGICAL:  Cranial nerves II through XII intact. Strength 5/5  throughout. The patient is alert and follows commands.   LABORATORY DATA:  Head CT negative. PT 14.3, INR 1.1, glucose 173, sodium  142, potassium 3.6, chloride 113, CO2  19, BUN 12, creatinine 1.0. LFTs  normal. WBC 16.6. Hemoglobin 13.8, platelets 285,000. Neutrophils 83%.   ASSESSMENT/PLAN:  1.  Seizure with questionable etiology:  Per husband, the patient had a      similar episode four years ago. Differential diagnosis are medication      withdrawal since she missed benzodiazepine for one day. Also infection      is less likely because she does not have any fever, neck stiffness or      any history of recent illness.  2.  Hypoglycemia:  Her blood sugar is fine.  3.  Thyroid disease:  She is on Synthroid. We will check a TSH. No history      of alcohol use. At this time, I will load her with Dilantin. Discuss      with Dr. Sunny Schlein. Pearlean Brownie, neurology. We will load her with Dilantin 20      mg per kg IV x1 dose and then 100 mg every eight hours p.o. We will hold      on lumbar puncture for now and he will see her in the morning. Would      also hold her Wellbutrin since it lowers seizure threshold and would      continue her other  medications.  4.  Hypertension:  Continue Lisinopril.  5.  Hypothyroidism:  Check TSH.  6.  Bipolar disorder:  We will continue Trileptal and Klonopin.       NJ/MEDQ  D:  11/25/2004  T:  11/25/2004  Job:  [id number]   cc:   Andrea Quarry, MD   [name], M.D.  Fax: [phone]

## 2010-09-29 NOTE — Discharge Summary (Signed)
NAMEAILEA, RHATIGAN NO.:  [id number]   MEDICAL RECORD NO.:  [id number]          PATIENT TYPE:  INP   LOCATION:  3730                         FACILITY:  [facility]   PHYSICIAN:  Sherin Quarry, MD      DATE OF BIRTH:  [birth year]   DATE OF ADMISSION:  11/24/2004  DATE OF DISCHARGE:  11/27/2004                                 DISCHARGE SUMMARY   HISTORY OF PRESENT ILLNESS:  [name] is a 77 year old lady with a past  history of colon cancer, leukemia, bipolar disorder, and hypothyroidism who  was brought to the [facility] emergency room on 11/24/2004 after her husband  witnessed her to have a generalized tonic clonic seizure. There was loss of  bowel and bladder control. Her husband recalled that she had a similar  episode about 4 years ago. While EMS was transporting her to the [facility]  emergency room, she had a second seizure and received 10 milligrams of  intravenous Valium. At the time that she was seen by Dr. Olena Leatherwood in the  emergency room, she was postictal.   PHYSICAL EXAMINATION AT TIME OF ADMISSION:  VITAL SIGNS: Temperature was 98,  blood pressure on 05/1932 52, pulse was 91, respirations 24.  HEENT: Within normal limits. The pupils were equal and reactive.  CHEST: Clear.  CARDIOVASCULAR:  Revealed normal S1-S2 without gross murmurs or gallops.  ABDOMEN: Benign. There are normal bowel sounds. No masses or tenderness.  NEUROLOGIC: Within normal limits.  EXTREMITIES: No cyanosis or edema.   CONSULTATIONS:  Consultation was obtained from Pramod P. Pearlean Brownie, M.D. of the  neurology service.  Dr. Pearlean Brownie recommended that the patient be treated with  Dilantin on a short-term basis and that her dose of Trileptal be increased  to 450 milligrams b.i.d. He felt that the patient did not need to remain on  Dilantin after she was discharged. He recommended an MRI scan of the brain  be performed and this was done 11/25/2004 and showed only atrophy and  chronic ischemic  changes. No acute abnormalities were detected.   LABORATORY STUDIES:  During the course of the patient's hospitalization,  laboratory studies obtained included CBC which revealed hemoglobin of 13.8,  sodium 142, potassium 3.6. Glucose was initially 173, creatinine was 1.0.  TSH was in the normal range. A prolactin level was obtained which was within  normal range. Blood and urine cultures were obtained and these were  negative. The patient's vital signs were carefully monitored. Blood pressure  was 120/55 on 11/27/2004. No subsequent seizures occurred and, therefore, on  11/27/2004, the patient was discharged.   DISCHARGE DIAGNOSES:  1.  Generalized tonic clonic seizure x2,  2.  History of thyroid disease.  3.  Hypertension.  4.  Hypothyroidism.  5.  Bipolar disorder.   PLAN:  Plan at the time of discharge is for the patient to increase her dose  of Trileptal to 450 milligrams b.i.d. She will continue hydroxyurea,  Lisinopril, Synthroid, Paxil, and Klonopin. She is advised to withhold the  Wellbutrin. I discussed her status with her husband and urged her to follow  up with her psychiatry doctor. Apparently, her  previous psychiatrist, Dr. Senaida Ores, has indicated that she is not going  to be seeing patients in the future and a new psychiatrist has been arranged  for her. This is particularly important since her Wellbutrin has been  discontinued. She will also follow up with Dr. Nicholos Johns on a p.r.n. basis.       SY/MEDQ  D:  11/27/2004  T:  11/27/2004  Job:  [id number]   cc:   Elias Else, M.D.  [address]  [address]  [place], [place] [address]  FAX# [phone]   Pramod P. Pearlean Brownie, MD  Fax: [phone]

## 2010-09-29 NOTE — Discharge Summary (Signed)
[place]  Patient:    [name], [name]                      MRN: [id number] Adm. Date:  [id number] Disc. Date: 07/25/99 Attending:  Ronaldo Miyamoto Dictator:   Abelino Derrick, P.A.C. [facility] CC:         Veneda Melter, M.D. [facility]                           Discharge Summary  DISCHARGE DIAGNOSES:  1. Non-ischemic cardiomyopathy with catheterization this admission.  2. Chronic myelogenous leukemia.  3. History of bipolar disorder.  4. History of hypertension.  HOSPITAL COURSE:  The patient is a 77 year old female with chronic myeloplastic  anemia and bipolar disorder and a history of severe LV dysfunction with an EF of 27%.  She was seen July 21, 1999 in the office for chest pain.  She was admitted and started on IV heparin.  CK-MBs and troponins were obtained.  These were negative.  She was set up for a diagnostic catheterization, which was done July 24, 1999 by Dr. Chales Abrahams.  This revealed normal coronaries.  Dr. Donne Hazel note  describes a moderate LV dysfunction.  She was kept overnight after her catheterization because of some grogginess after her sedation.  She was discharged the next morning, on July 25, 1999.  DISCHARGE MEDICATIONS:  1. Clorazepate 3.75 1/2 tablet three times a day.  2. Dextrostat 10 mg once a day.  3. Synthroid 0.112 mg a day.  4. Luvox 100 mg 1-1/2 tablet in the morning and evening.  5. Hydrea 500 mg once a day.  6. Topamax 100 mg in the morning and 100 mg at night.  7. Risperdal 1 mg at night.  8. Depakote 500 mg hospital  9. Zestril 10 mg a day. 10. Hydrochlorothiazide 25 mg was added at the time of discharge. 11. Prilosec 20 mg was added at the time of discharge.  LABORATORY DATA:  Renal profile shows sodium 137, potassium 3.1, BUN 8, creatinine 0.8 on July 24, 1999.  B12 level is 821, folate 350.  White count 5.9, hemoglobin 11.3, hematocrit 33.3, MCV 109, platelet count 215.  INR is 1.2.  Lipid  profile  shows a cholesterol of 170, HDL 52, LDL 96.  CK-MBs and troponins were negative. TSH was slightly low at 0.276.  This will need to be repeated as an outpatient.   EKG shows normal sinus rhythm, left bundle branch block.  DISPOSITION:  The patient is discharged in stable condition.  FOLLOW-UP:  Follow up with Dr. Chales Abrahams in the office on August 08, 1999.  She needs a followup TSH as an outpatient. DD:  07/25/99 TD:  07/25/99 Job: [id number]

## 2010-09-29 NOTE — Discharge Summary (Signed)
NAMEMarland Kitchen  [name], [name]                         ACCOUNT NO.:  [id number]   MEDICAL RECORD NO.:  [id number]                   PATIENT TYPE:  INP   LOCATION:  0357                                 FACILITY:   PHYSICIAN:  Adolph Pollack, M.D.            DATE OF BIRTH:  Nov 12, 1927   DATE OF ADMISSION:  02/23/2003  DATE OF DISCHARGE:  03/05/2003                                 DISCHARGE SUMMARY   PRINCIPAL DISCHARGE DIAGNOSIS:  Stage 3 colon cancer.   SECONDARY DIAGNOSES:  1. Cardiomyopathy.  2. Postoperative pneumonia.  3. Postoperative ileus.  4. Cholelithiasis.  5. Bipolar disorder.  6. Leukemia.  7. Gastroesophageal reflux disease.  8. Congestive heart failure postoperative.  9. Ileus.  10.      Hypokalemia.  11.      Deconditioned state postoperative.   REASON FOR ADMISSION:  [name] is a 77 year old female with  cholelithiasis and gallbladder disease.  She had a colonoscopy following a  bout of acute diverticulitis and was found to have a right colon cancer.  She was admitted for elective laparoscopic colectomy and cholecystectomy.   HOSPITAL COURSE:  She underwent the laparoscopic cholecystectomy with  intraoperative cholangiogram and laparoscopic right colectomy.  Her  postoperative course was complicated by some hypoxemia and rhonchi.  She had  postoperative fever and some mild congestive heart failure as well as  development of right upper lobe infiltrate.  She was started on antibiotics  for pneumonia and medical consultation was obtained.  Diuresis was started  as well as pulmonary treatment.  She did have somewhat of an ileus in a  deconditioned state.  Her ileus began to resolve and her diet was advanced.  Physical and occupational therapy were asked to see her.  It was felt that  in her deconditioned state she would need further outpatient rehabilitation  type care.  Medical oncology (Dr. Myna Hidalgo) was contacted about her new  diagnosis as he follows her for  leukemia.  He felt that he would like to see  her as an outpatient to discuss further treatment options.  She had some  hypokalemia and was given some potassium and that corrected that.  By  March 05, 2003, she was afebrile, bowels are moving, she was eating well  and was felt to be satisfactory for discharge to the [facility].   DISPOSITION:  Discharged to the [facility].   ACTIVITY:  No lifting over 10-20 pounds.  She may do occupational therapy  and physical therapy with those previous limitations.   DIET:  Regular.   MEDICATIONS:  1. Hydroxyurea 500 mg p.o. on odd days, 1000 mg p.o. on even days.  2. Wellbutrin 150 mg p.o. t.i.d.  3. Lisinopril 10 mg p.o. daily.  4. Albuterol inhaler treatments q.6h p.r.n.  5. Guiafenesin 1200 mg p.o. b.i.d. x5 days and then discontinue.  6. Prilosec  20 mg daily.  7. Trileptal 300 mg b.i.d.  8. Levothyroxine 125 mcg daily.  9. Paxil CR 25 mg daily.  10.      Risperdal 2 mg q.h.s.  11.      Augmentin 875 mg p.o. q.12h x10 days then stop (for pneumonia).  12.      Clonopin 1 mg p.o. t.i.d.  13.      Tylenol 650 mg p.o. q.4h p.r.n. pain.   FOLLOW UP:  She will have followup arranged with Dr. Myna Hidalgo through his  office.  She needs to follow up with me in my office in two to three weeks  and call for an appointment at [phone].  We would also like to be called if  she begins having problems with vomiting, wound problems or persistent high  fevers.  I will order her a flu shot prior to her discharge.                                               Adolph Pollack, M.D.    Kari Baars  D:  03/05/2003  T:  03/05/2003  Job:  [id number]   cc:   Dr. Fulton Mole   Gretta Cool, M.D.  [address] [place]  [place] [address]  Fax: [phone]   Rose Phi. Myna Hidalgo, M.D.  [address]  [place], [place] [address]  Fax: [phone]   Llana Aliment. Malon Kindle., M.D.  [address]  [place]  [place] [address]  Fax: [phone]

## 2010-09-29 NOTE — Discharge Summary (Signed)
[place]  Patient:    [name], [name]                      MRN: [id number] Adm. Date:  [id number] Disc. Date: 08/01/99 Attending:  Miguel Aschoff CC:         [place] S. Wynonia Lawman, M.D.             Rose Phi. Myna Hidalgo, M.D.             Veneda Melter, M.D. [facility]             Dorina Hoyer, M.D.                           Discharge Summary  DATE OF BIRTH:  1927-09-18  CONSULTS:  Dorina Hoyer, M.D., [facility] S. Wynonia Lawman, M.D.  PROCEDURES:  None.  DISCHARGE DIAGNOSES:  1. Toxic encephalopathy secondary to oversedation from additive effects of     psychotropic medications.  2. Hypothyroidism with overreplacement (thyroid stimulating hormone 0.276, July 21, 1999).  3. Mild hypokalemia, resolved.  4. Chronic myelogenous leukemia diagnosed September, 2000.  5. History of bipolar disorder.  6. History of recent chest pain requiring admission to [facility] and     catheterization, July 24, 1999, normal coronaries.  7. History of nonischemic cardiomyopathy, ejection fraction 27%, October, 1999,     "moderate LV dysfunction" last week per cardiology admission.  8. History of chronic left bundle branch block.  9. History of hypertension. 10. 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 11. History of hysterectomy and right total hip replacement.  DISCHARGE MEDICATIONS:  1. Synthroid 0.088 mg one p.o. q.d. (this represents decreased dose).  2. Hydrochlorothiazide 25 mg one p.o. q.d.  3. Potassium chloride 10 mEq one p.o. q.d.  4. Hydrea 500 mg p.o. q.d.  5. Prilosec 20 mg p.o. q.d.  6. Patient is advised not to take Zestril until blood pressure rechecked by     Dr. Chales Abrahams (levels have been running lower than usual).  She also should not     take clorazepate, Luvox, Topimax, Risperdal, Depakote, or Dextrostat until     rechecked by Dr. Wynonia Lawman next week.  DISCHARGE FOLLOW-UP:  Patient has previously scheduled appointments  with Drs. Berline Chough, and Hejazi within the next week.  She is to schedule an appointment in one month with Dr. Renato Gails to have TSH rechecked.  HISTORY OF PRESENT ILLNESS:  [name] is a 77 year old woman with history of  bipolar disorder actively followed by Dr. Wynonia Lawman; her most recent medication change was the addition of Dextrostat and Depakote approximately 10 days ago, after which she had become progressively more lethargic and weak.  A similar presentation in September, 2000 had occurred when I admitted her to [facility] - she improved after withholding her sedatives.  One week ago, she developed chest pain and was admitted to the [facility] cardiology service, undergoing catheterization which revealed normal coronaries.  A noncardiac etiology for her chest pain was entertained, and Prilosec was begun empirically. Also, at that time, HCTZ was added to her regimen, and she was advised to try and increase the potassium in her diet.  Other significant finding that admission was TSH suppressed at 0.276 indicating overreplacement.  Her dose was not adjusted t that time.  Husband was quite concerned that she had been "out of it" for the duration of that  admission and subsequently, with no interest in eating, talking, or moving. She slept constantly and was very difficult to assist in ambulation.  She was described as appearing "drugged."  Mr. Bryner contacted Dr. Dalia Heading office the day prior o admission who advised blood work at [facility], the nature and results of which were pending at the time of admission.  PHYSICAL EXAMINATION ON ADMISSION:  VITAL SIGNS:  Normal temperature, blood pressure 129/69, pulse 86, respirations 20, and 99% on room air.  We could not move her for evaluation of her orthostatics.  GENERAL:  She appeared very sleepy/lethargic, opening eyes, and answering simple questions with short answers before falling back to sleep.  She  truly did appear sedated, and at least, at one point while in the emergency room, could be described as stuporous.  HEENT:  Her pupils were 2 mm bilaterally with extraocular movements intact. Her mouth was dry, face was symmetric.  LUNGS:  Clear to auscultation with regular shallow respirations.  HEART:  Regular rate and rhythm.  EXTREMITIES:  She moved all extremities although exhibited generalized weakness. Her sensation was grossly intact.  Her toes were downgoing.  DTRs were 1+ at the biceps but could not elicit patella or Achilles.  Could not perform finger-to-nose secondary to sedation.  SKIN:  Warm and dry.  LABORATORY DATA:  EKG revealed no change in the chronic left bundle branch block pattern.  Chest x-ray was clear.  Hemoglobin 13.6, WBC 7, platelets 211, MCV 108.  Sodium 140, potassium 3.1, chloride 105, bicarb 27, BUN 15, creatinine 0.9, glucose 108.  LFTs within normal limits.  Depakote level 74.5, CK-MB 469 and 9.6, with troponin less than 0.03.   [name] appeared to have some type of metabolic or toxic encephalopathy, with no evidence of CVA, seizures, infection, renal, hepatic, or endocrine disorders. Favored diagnoses included oversedation secondary to psychotropic medications for depressed mood and catatonic state.  She was admitted to the telemetry unit for  further observation.  HOSPITAL COURSE: #1 - TOXIC ENCEPHALOPATHY:  All of [name] psychotropic medications were held.  Within 24 hours, she was beginning to perk up somewhat, and improvement which continued over the course of her hospitalization.  Unfortunately, Dr. Wynonia Lawman and his partner were out of town over the weekend, so psychiatric consultation as provided by Dorina Hoyer.  He agreed with diagnosis of toxic encephalopathy secondary to unintended overmedication by psychotropic medications.  Upon Dr. Dalia Heading return, he visited patient the evening prior to discharge and felt she  was stable  to return home, agreeing with discontinuation of all psychotropic medications until he could reevaluate her in the office the following week.  Mr. Catano knows how to reach his office should she develop a change in mood or crisis.  #2 - HYPOKALEMIA:  [name] potassium was low at 3.1 which was repleted. She has been unable to meet her potassium requirements by dietary supplementation, nd I will add potassium supplement to her regimen.  #3 - RELATIVE HYPOTENSION:  Note, [name] blood pressures have been running systolic range 115 to 124 without Zestril.  No fluid overload or any evidence of uncompensated CHF was noted this admission.  Given her relatively low blood pressures, I have withheld the Zestril, which can be begun again upon reevaluation by Dr. Chales Abrahams at her follow-up appointment.  #4 - HISTORY OF RECENT CHEST PAIN:  Note, [name] experienced no chest pain  this admission.  #5 - OVERREPLACED HYPOTHYROIDISM:  [name] Synthroid dose has  been reduced  from 0.112 mg to 0.088 mg p.o. q.d.  She should have repeat TSH at one month and again at two months for follow-up.  Mr. Balbuena requests that further monitoring of her hypothyroidism be done at Dr. Hebert Soho rather than Dr. Zella Ball office.  DISCHARGE CONDITION:  [name] is discharged awake and alert, with good appetite, interactive with normal mood, experiencing no mania or depression. Her mental status is much more baseline according to her husband.  No focal neurologic deficits were revealed this admission.  She is discharged in stable condition. DD:  08/01/99 TD:  08/01/99 Job: [id number]

## 2010-09-29 NOTE — Consult Note (Signed)
NAME:  [name], [name]                         ACCOUNT NO.:  [id number]   MEDICAL RECORD NO.:  [id number]                   PATIENT TYPE:  INP   LOCATION:  0344                                 FACILITY:  [facility]   PHYSICIAN:  Adolph Pollack, M.D.            DATE OF BIRTH:  November 10, 1927   DATE OF CONSULTATION:  06/28/2003  DATE OF DISCHARGE:                                   CONSULTATION   REASON FOR CONSULTATION:  Ileus.   HISTORY OF PRESENT ILLNESS:  Ms. Mahajan is a 77 year old female status post  laparoscopic-assisted right colectomy for stage III colon cancer, February 15, 2003.  She had a prolonged postoperative recovery secondary to being fairly  weak.  She subsequently started some chemotherapy, and her husband has said  she has had some significant weakness, and then about a week ago developed  some dark diarrhea that persisted.  Her weakness increased.  She had a poor  oral intake.  She subsequently was sent to the emergency room on June 24, 2003.  At that time, she was noted to have an INR of 12 (had been taking  1 mg a day of Coumadin because of Port-A-Cath), and was profoundly  hypokalemic.  CT scan demonstrated findings consistent with ileus.  No  evidence of obstruction.  She was put in the hospital, Coumadin was held.  She was given intravenous hydration.  She also had adjustment of some of her  psychiatric medications.  She had a repeat CT scan secondary to persistent  distention, and again demonstrated findings consistent with ileus.  No  masses, no recurrences.  Contrast was all the way through to the rectal  sigmoid.  No evidence of obstruction or acute infectious or inflammatory  processes.  I was asked to see her because of the persistent ileus.  She has  been very lethargic since admission.  She is somewhat difficult to arouse  now.  Denies abdominal pain at this time.   PAST MEDICAL HISTORY:  1. Stage III colon cancer.  2. Cardiomyopathy.  3.  Hypertension.  4. Leukemia.  5. Hypothyroidism.  6. Bipolar disorder.  7. Gastroesophageal reflux disease.   PREVIOUS OPERATIONS:  1. Hysterectomy.  2. Right hip replacement.  3. Left hip replacement.  4. Laparoscopic-assisted right colectomy.  5. Port-A-Cath insertion.   ALLERGIES:  STELAZINE.   CURRENT MEDICATIONS:  1. Prinivil.  2. Protonix.  3. Potassium chloride.  4. Trileptal.  5. Levothyroxine.  6. Klonopin.  7. Wellbutrin.  8. Paxil.  9. Dilaudid p.r.n.  10.      Tylox p.r.n.  11.      Lomotil p.r.n.  12.      Ambient p.r.n.  13.      Zofran p.r.n.  14.      Phenergan p.r.n.   SOCIAL HISTORY:  She is married, and her husband is here with her.  No  tobacco or alcohol use.  REVIEW OF SYSTEMS:  According to the husband, she was having multiple loose  bowel movements a day, but no urinary complaints.  Other review of systems  is difficult in her current mental state.   PHYSICAL EXAMINATION:  GENERAL:  A lethargic, ill-appearing female who was  very weak.  She opens her eyes to voice, but responds minimally.  SKIN:  Warm and dry, no jaundice.  NECK:  Supple without palpable masses or obvious thyroid enlargement.  NODES:  No palpable cervical, supraclavicular, or periumbilical adenopathy.  ABDOMEN:  Soft, but distended.  Hypoactive bowel sounds are noted.  There is  no tenderness noted.  Well-healed scars are noted without hernias.  No  palpable masses.   LABORATORY DATA:  Current hemoglobin is 10.6, INR 1.5, white blood cell  count 9300.  Her albumin was 2.6 on admission.   CT scans and x-rays were reviewed as per HPI.   IMPRESSION:  1. Ileus that is multifactorial, including contributions from hypokalemia,     chemotherapy, inactivity, gastrointestinal bleeding secondary to over-     anticoagulation, narcotics, and psychiatric medications.  2. Severe deconditioned state.  3. Malnutrition.   RECOMMENDATIONS:  1. Minimize narcotics.  2. If she is unable  to take adequate oral intact (has been now started on a     clear liquid diet), would start her on parenteral nutrition.  3. Would try to get her out of bed.  Would at least get her involved in some     sort of activity at this time.  4. I suspect this will take quite a while to improve.  No indications for     operation at this time, and I think she is probably took weak to     withstand one.                                               Adolph Pollack, M.D.    Kari Baars  D:  06/28/2003  T:  06/28/2003  Job:  [id number]   cc:   Rose Phi. Myna Hidalgo, M.D.  [address] [facility]  [place], [place] [address]  Fax: [phone]   Jackie Plum, M.D.

## 2010-09-29 NOTE — Consult Note (Signed)
[name], [name] NO.:  [id number]   MEDICAL RECORD NO.:  [id number]          PATIENT TYPE:  INP   LOCATION:  3730                         FACILITY:  [facility]   PHYSICIAN:  Pramod P. Pearlean Brownie, MD    DATE OF BIRTH:  1927/09/12   DATE OF CONSULTATION:  11/25/2004  DATE OF DISCHARGE:                                   CONSULTATION   REFERRING PHYSICIAN:  Sherin Quarry, M.D.   HISTORY OF PRESENT ILLNESS:  [name] is a 77 year old Caucasian lady who  was brought in yesterday for two episodes of ___________tonic-clonic  seizures.  The first episode occurred in the evening when she was watching  TV.  She remembers watching the news and being disturbed by the events of  the [place].  Husband described the patient as having tonic-clonic  posturing with jerking of the extremities with loss of bowel and bladder  control.  The patient was unresponsive.  He called EMS and had her  transported by EMS.  She had a second episode which was similar.  She was  treated with Valium 10 mg, and was post-ictal and confused upon arrival.  She has improved today and is back to her neurological baseline.  The  patient is unable to describe the episodes, but can remember events up to  losing consciousness.  She denies any headache today.  She had a similar  episode several years ago, she is not able to recall quite well today.  As  per the husband, it was similar.  She has no known neurological history.  No  history of stroke, TIA's, or seizures.   PAST MEDICAL HISTORY:  1.  Hypertension.  2.  Hypothyroidism.  3.  Colon cancer, stage 3, status post surgery and chemotherapy.  4.  Bipolar disorder.  5.  Gastroesophageal reflux disease.  6.  Leukemia.  7.  Left shoulder injury three years ago with chronic pain.   PAST SURGICAL HISTORY:  Left hip replacement.   FAMILY HISTORY:  Not significant for anybody with epilepsy or neurological  problems.   SOCIAL HISTORY:  The patient  lives with her husband.  She is retired.  Does  not smoke or drink.   HOME MEDICATIONS:  1.  Hydroxyurea.  2.  Wellbutrin.  3.  Lisinopril.  4.  Trileptal 300 mg b.i.d.  5.  Synthroid.  6.  Paxil.  7.  Klonopin.   ALLERGIES:  STELAZINE.   REVIEW OF SYSTEMS:  As stated above.   PHYSICAL EXAMINATION:  GENERAL:  A frail, middle-aged Caucasian lady who is  not in distress.  VITAL SIGNS:  She is afebrile with a pulse rate of 72 per minute and  regular, respiratory rate 16 per minute, _____________.  HEENT:  Head is non-traumatic.  ENT exam significant for decreased hearing  bilaterally.  NECK:  Supple without bruit.  CARDIAC:  No murmur or gallop.  LUNGS:  Clear to auscultation.  EXTREMITIES:  Left shoulder movements are limited due to pain.  NEUROLOGIC:  She is awake, alert, and oriented x3, with normal speech and  language function.  She has decreased hearing and hence has some difficulty  in communication.  Eye movements are full range without nystagmus.  Visual  ____________.  Face is symmetric.  Bilateral movements are normal.  Tongue  is midline.  Motor examination reveals symmetric upper and lower extremity  strength, tone, reflexes, coordination, and sensation.  Left shoulder  movements are limited due to pain.   LABORATORY DATA:  Non-contrast CAT scan of the head yesterday reveals no  acute abnormality.  Admission labs fairly unremarkable except for elevated  white count of 15.6.   IMPRESSION:  A 77 year old lady with unprovoked two episodes of generalized  tonic-clonic seizures, exact etiology unclear at the present time.   PLAN:  I agree with treating with Dilantin at least short-term.  Since she  is already on Trileptal for her bipolar disorder, I would recommend  increasing the Trileptal dose to 450 mg b.i.d.  May keep her on Dilantin  during the hospitalization, but may not be continued after she is  discharged.  Arrange for MRI of the brain with and without  contrast to rule  out any structural abnormalities in the brain as well as to check EEG and  urine drug screen.  I agree with holding Wellbutrin as it may lower seizure  threshold.  Consult with the psychiatrist to use alternative medications.  Thank you for the referral.       PPS/MEDQ  D:  11/25/2004  T:  11/25/2004  Job:  [id number]

## 2010-09-29 NOTE — H&P (Signed)
[place]  Patient:    [name], [name]                        MRN: [id number] Adm. Date:  11/25/00 Attending:  Fayrene Fearing P. [place], M.D. Dictator:   [employer], [employer].-C.                         History and Physical  DATE OF BIRTH:  Aug 18, 1927  CHIEF COMPLAINT:  Left hip pain.  HISTORY OF PRESENT ILLNESS:  Patient has had progressive problems with her left hip.  It has been increasing in frequency and duration.  It is currently interfering with her activities of daily living and it was thought that her best course of action at this point would be a total hip replacement on the left side.  Risks and benefits were discussed with the patient and she and her family decided to proceed with a hip replacement.  PAST MEDICAL HISTORY:  Significant for hypertension, gastroesophageal reflux disease, hypothyroid with a goiter, clinical depression and anxiety, chronic myelocytic anemia.  ALLERGIES:  No known drug allergies.  MEDICATIONS:  1. Hydroxyurea 500 mg one tablet on one day followed by two tablets the next     day.  2. Prilosec 20 mg one q.d.  3. Prinivil 10 mg one q.d.  4. Synthroid 88 mcg one q.d.  5. Hydrochlorothiazide 25 mg one q.d.  6. Potassium chloride 1 mg one q.d.  7. Paxil 40 mg one q.d.  8. Trileptal 300 mg two q.d.  9. Risperdal 1 mg one q.d. 10. Ambien 10 mg one q.h.s. p.r.n.  PAST SURGICAL HISTORY:  Significant for a right total hip arthroplasty done in 1991, hysterectomy done in 1980.  SOCIAL HISTORY:  Patient denies tobacco use, denies alcohol use.  Lives at home with her husband and plans to go home upon discharge from the hospital with the assistance of ______ Home Health.  Patients primary care physician is [facility], Dr. Lattie Corns and Dr. Azucena Kuba.  Patients psychiatrist is Dr. Evelene Croon.  Patients oncologist is Dr. Arlan Organ.  Patient will be seeing Dr. Allyson Sabal who is a cardiologist for clearance prior to  surgery as well.  FAMILY HISTORY:  Unknown.  REVIEW OF SYSTEMS:  GENERAL:  The patient denies fever, chills, night sweats, or bleeding tendencies.  CNS:  Denies blurred vision, double vision, seizures, headache, or paralysis.  PULMONARY:  Denies shortness of breath, productive cough, hemoptysis.  CARDIOVASCULAR:  Denies chest pain, angina, orthopnea, or claudication.  GASTROINTESTINAL:  Denies nausea, vomiting, diarrhea, melena, or bloody stool.  Does have occasional constipation which is relieved with over-the-counter laxatives.  GENITOURINARY:  Denies dysuria, hematuria, or discharge.  MUSCULOSKELETAL:  Denies any tingling or weakness in the extremities.  PHYSICAL EXAMINATION  VITAL SIGNS:  Blood pressure 140/80, respirations 14 and unlabored, pulse 80 and regular.  GENERAL:  Patient is a 77 year old white female who is alert and oriented.  HEENT:  Pupils are equal, round and reactive to light.  Pharynx is clear. Mucosa is pink and moist.  Nares are patent bilaterally.  NECK:  No bruits appreciated.  Soft and supple to palpation.  No thyromegaly or nodules appreciated.  CHEST:  Clear to auscultation bilaterally anterior and posterior.  BREASTS:  Not pertinent.  Not examined.  HEART:  Regular rate and rhythm.  No murmurs, gallops, or rubs appreciated. Normal S1, S2.  ABDOMEN:  Positive bowel  sounds throughout.  Soft and supple to palpation. Nontender.  No organomegaly noted.  GENITOURINARY:  Not pertinent and not performed.  EXTREMITIES:  As per office notes.  SKIN:  Intact without any rashes or lesions.  LABORATORIES:  X-ray showed bone on bone deformity at the superior portion of the acetabulum, large cyst in the femoral head.  IMPRESSION:  End-stage osteoarthritis of the left hip.  PLAN:  Admit to [facility] for a left total hip replacement to be done by Dr. Marlowe Kays utilizing [employer].  This will be on November 25, 2000.  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  Her CML should in no way compromise her surgery or her recovery.  Her immune system is fine.  She has no increased risk of bleeding so there should be no extra precautions necessary to prevent bleeding.  This is clearance from Dr. Rose Phi. Ennever.  The patient did go to see her primary care physician today who did an EKG and found some abnormalities in that and wanted her to obtain cardiac clearance prior to him feeling comfortable clearing her for surgery so the patient is seeing Dr. Excell Seltzer who is a cardiologist and she is going to see him tomorrow which is Tuesday, July 9, to obtain the cardiac clearance and that will be faxed to the hospital upon [place] receiving it.  Dr. Lattie Corns did, however, clear [name] from a medical standpoint pending her blood work and cardiac clearance. DD:  11/18/00 TD:  11/18/00 Job: [id number]

## 2010-09-29 NOTE — Cardiovascular Report (Signed)
. [facility]  Patient:    [name], [name]                      MRN: [id number] Proc. Date: 07/24/99 Adm. Date:  [id number] Attending:  Ronaldo Miyamoto CC:         Alfonse Alpers. Dagoberto Ligas, M.D.             Arvella Merles, M.D.                        Cardiac Catheterization  PROCEDURES PERFORMED: 1. Left heart catheterization. 2. Left ventriculogram.  DIAGNOSES: 1. Normal coronary arteries by angiogram. 2. Moderate left ventricular dysfunction by echocardiogram.  HISTORY:  [name] is a 77 year old white female with a history of nonischemic cardiomyopathy, hypothyroidism, and bipolar disorder, who presents with substernal chest discomfort.  The patient has moderate LV dysfunction by echocardiogram and has a nonischemic Cardiolite in the past.  The patient was admitted to the hospital with complaints of chest pain and subsequently ruled out for acute myocardial infarction.  She is brought now to the catheterization lab for assessment of her coronary arteries.  DESCRIPTION OF PROCEDURE:  After informed consent was obtained, the patient was  brought to the catheterization lab where both groins were sterilely prepped and  draped.  Lidocaine, 1%, was used to infiltrate the right groin.  A 6-French sheath was placed in the right femoral artery using a modified Seldinger technique. The 6-French JL4 and JR4 catheters were then used to engage the left and right coronary arteries and a selective angiography was performed in various projections using  manual injections of contrast.  A 6-French pigtail catheter was then advanced to the left ventricle and a left ventriculogram was performed using power injections of contrast.  At the termination of the case, a Perclose suture closure device as deployed to the right groin after the area was re-prepped and draped.  The patient tolerated the procedure well and was transferred to the floor in  stable condition.  FINDINGS:  Are as follows: 1. The left main trunk:  Normal. 2. LAD:  Is a medium caliber vessel that provides two diagonal branches.  The LAD    system is angiographically normal. 3. Ramus intermedius:  This is a medium caliber vessel that provides the    anterolateral wall and is angiographically normal. 4. The left circumflex:  This is a large caliber vessel that provides three    marginal branches and a posterior descending artery.  The left circumflex    system is angiographically normal. 5. The right coronary artery is nondominant and is a small caliber vessel that    provides an RV marginal branch.  The right coronary system is angiographically    normal.  Left ventriculogram:  Normal end diastolic dimension.  Overall left ventricular  function is difficult to assess due to significant ectopy.  LV function is 137/10; aortic was 136/70; LVEDP equals 15.  ASSESSMENT AND PLAN:  [name] is a 77 year old white female with nonischemic  chest pain.  At this point, other causes of chest discomfort will be investigated and treated medically.  We will continue to treat her cardiomyopathy as before. DD:  07/24/99 TD:  07/24/99 Job: [id number]

## 2010-09-29 NOTE — Op Note (Signed)
   NAME:  [name], [name]                         ACCOUNT NO.:  [id number]   MEDICAL RECORD NO.:  [id number]                   PATIENT TYPE:  AMB   LOCATION:  ENDO                                 FACILITY:  [facility]   PHYSICIAN:  James L. Malon Kindle., M.D.          DATE OF BIRTH:  1927-06-03   DATE OF PROCEDURE:  01/26/2003  DATE OF DISCHARGE:                                 OPERATIVE REPORT   PROCEDURE:  Colonoscopy and biopsy.   MEDICATIONS:  Fentanyl 87.5 mcg, Versed 9 mg IV.   SCOPE:  Olympus pediatric scope.   INDICATION:  The patient had a recent bout of diverticulitis and was  referred over for a colonoscopy.  She had severe left-sided pain, it got  better, this was a new thing for her.  There is a family history of breast  cancer but no family history of GI cancer.   DESCRIPTION OF PROCEDURE:  The procedure had been explained the patient and  consent obtained.  With the patient in the left lateral decubitus position,  the Olympus pediatric scope was inserted and advanced under direct  visualization.  The prep was quite good.  The patient had extensive  diverticular disease of the sigmoid colon, but eventually we were able to  pass the sigmoid colon and advance over to the cecum.  The ileocecal valve  and appendiceal orifice were seen.  The scope was withdrawn and  approximately 10 cm proximal to the ileocecal valve on apparently the  mesenteric border was a small, approximately 2-cm ulcerated hard mass.  It  was biopsied, it was quite hard and had the appearance of a small carcinoma.  Multiple biopsies were taken.  The remainder of the ascending colon and  transverse colon were unremarkable other than some scattered diverticula.  The descending and sigmoid colon revealed extensive diverticulosis.  No  polyps or other lesions were seen.  The scope was withdrawn and the patient  tolerated the procedure well.   ASSESSMENT:  1. Probable ascending colon cancer.  2. Extensive  diverticulosis, primarily the left colon.    PLAN:  We will check pathology, give her diverticulosis information sheet  and discuss further approach with Dr. Abbey Chatters.                                               James L. Malon Kindle., M.D.    Waldron Session  D:  01/26/2003  T:  01/26/2003  Job:  [id number]   cc:   Adolph Pollack, M.D.  [address]  [place]  [place] [address]  Fax: [phone]   Elana Alm. Nicholos Johns, M.D.  [address]  [place]  [place] [address]  Fax: [phone]

## 2010-09-29 NOTE — Consult Note (Signed)
NAME:  [name], [name]                         ACCOUNT NO.:  [id number]   MEDICAL RECORD NO.:  [id number]                   PATIENT TYPE:  INP   LOCATION:  0357                                 FACILITY:  [facility]   PHYSICIAN:  Jackie Plum, M.D.             DATE OF BIRTH:  04-11-1928   DATE OF CONSULTATION:  03/01/2003  DATE OF DISCHARGE:                                   CONSULTATION   REASON FOR CONSULTATION:  Breathing difficulty with mental status change.   PROBLEM LIST:  1. Change in mental status secondary to pneumonia.  2. Status post acute respiratory distress secondary to congestive heart     failure and nosocomial pneumonia.  3. Congestive heart failure.  4. Nosocomial pneumonia.  5. Adynamic ileus.  6. Leukocytosis secondary to pneumonia.  7. History of macrocytic anemia.     a. Hemoglobin and mean corpuscular volume 10.3 and 102.7 in January 2002,        and 9.1 and 106.5, respectively, today.  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  8. History of nonischemic cardiomyopathy, 2 D echo documented to have shown     a moderate left ventricular dysfunction and a cardiac catheterization in     March 2001 said to have been normal for her coronaries.  9. History of hypothyroidism.  10.      History of CML.  11.      History of gastroesophageal reflux disease.  12.      History of left bundle branch block.  13.      History of hypertension.  14.      History of depression.  15.      Status post cholecystectomy and right colectomy for cholelithiasis     and colon cancer.  16.      History of osteoarthritis.  17.      Allergy to STELAZINE.   We were asked by Marcial Pacas E. Earlene Plater, M.D., who is covering Adolph Pollack,  M.D., to evaluate the patient for mental status change and difficulty  breathing.  The patient is a 77 year old Caucasian lady with multiple  medical problems as mentioned above, who was admitted by Dr. Abbey Chatters on  February 23, 2003, for a laparoscopic cholecystectomy and right colectomy for  cholelithiasis and suspected colon cancer, respectively.  The patient  subsequently had the operative procedures on February 23, 2003, and was  apparently recovering smoothly until February 26, 2003, around 4 o'clock in  the morning, when she was found to be in acute respiratory distress with O2  saturation in the low 80s.  She was also said to be tachycardic with heart  rates of 140-150s.  She had a fever of 102.0 degrees Fahrenheit.  Nursing  documentation progress notes also indicate that was wheezing diffusely with  rhonchi at the time.  She received IV Lasix 20 mg and improved marginally.  That same morning the patient was also found to have some abdominal  distention and an abdomen two-view revealed adynamic ileus.  The patient was  made NPO and started on IV Lasix 20 mg daily.  A 12-lead EKG w as obtained,  which was notable for normal sinus rhythm with left bundle branch block,  which is unchanged from a similar EKG done on February 19, 2003.  She was also  started on Zosyn 3.375 mg IV q.8h. on October 16, principally for nosocomial  pneumonia.  The patient's GI status has improved and is begun on a full  liquid diet as of the date of consultation.  We have been asked to see her  because of above reasons.  The patient denies any chest pains, fever or  chills, but admits to some nausea and mild shortness of breath.  She has  some mild abdominal soreness without any specific abdominal pain.  No lower  extremity pains or swelling.  She admits to cough productive of yellowish  sputum.   PAST MEDICAL HISTORY:  Please see problem list above.   CURRENT HOSPITAL MEDICATIONS:  1. Klonopin 1 mg p.o. t.i.d.  2. Hydroxyurea 500 mg q.48h.  3. Wellbutrin 150 mg p.o. t.i.d.  4. Dilaudid 10 mg IV q.4h. p.r.n. PCA.  5. Lisinopril 10 mg p.o. daily.  6. D5 normal saline at 75 mL/hr.  7. Zosyn 3.375 g q.8h.  8. Compazine 5-10 mg q.6h.  p.r.n.  9. Reglan 10 mg q.6h. p.r.n.  10.      Zofran 1 mg q.6h. p.r.n.  11.      Benadryl 12.5-25 mg q.6h. p.r.n.  12.      Narcan p.r.n.   ALLERGIES:  The patient is allergic to [facility], as mentioned above.   SOCIAL HISTORY:  She does not smoke cigarettes nor drink alcohol.   FAMILY HISTORY:  Notable for heart failure in her mother.   REVIEW OF SYSTEMS:  Significant positives and negatives are as mentioned in  the HPI, but otherwise review of systems was unremarkable.   PHYSICAL EXAMINATION:  VITAL SIGNS:  Temperature was 98.7 degrees  Fahrenheit, pulse 88, BB 154/72, respiratory rate of 20 per minute.  Her  weight was 172.4 pounds.  GENERAL:  She was not in acute cardiopulmonary distress.  HEENT:  She was pale without any conjunctival icterus.  Oropharynx was moist  without any exudation or erythema.  NECK:  Neck exam did not reveal any JVD.  Neck was supple.  CHEST:  She had vesicular breath sounds with mild to moderate bibasilar  rales and few wheezes.  CARDIAC:  She had a regular rate and rhythm without any gallops or murmurs.  CENTRAL NERVOUS SYSTEM:  She is alert, oriented x2, no acute focal deficits  were appreciated.  Panic sign was negative.  Brudzinski's sign was also  negative.  ABDOMEN:  Full, bowel sounds were present, they were normoactive.  No  obvious tenderness.  EXTREMITIES:  Negative for any edema.  No clubbing was appreciated.   LABORATORY DATA:  On February 28, 2003, WBC count was 15.8, hemoglobin 9.1,  hematocrit 26.7, MCV 106.5, platelet count 213.  Sodium 137, potassium 3.4,  chloride 108, CO2 26, glucose 123, BUN 10, creatinine 0.9, calcium 9.0.  A  12-lead EKG dated February 27, 2003, and February 19, 2003, were reviewed,  showed normal sinus rhythm with left bundle branch block.  No acute ST-wave  changes were seen.  Chest x-ray done on February 28, 2003, was  reviewed.  It showed improved pulmonary edema and right middle lobe air space disease   consistent with pneumonia and small right effusion.  She also had x-ray of  abdomen and chest on October 15, which showed adynamic ileus and pulmonary  edema with air space disease.  The patient prior to admission had an x-ray  on February 19, 2003, which was negative for any acute infiltrate.   Pathology report is pending.   IMPRESSION:  Congestive heart failure and Nosocomial pneumonia, which is  improving.  She has mental status change which may be related to her  pneumonia.  The patient had fever but, however, remains afebrile at this  point.   PLAN:  My plan is to decrease the IV fluid and increase the patient's Lasix  to 40 mg daily and monitor her I&O's. I&O is positive today.  Recommend a 2-  D echo.  The patient should be transferred to a telemetry bed in view of her  cardiac status.  The patient's microcytic anemia is not new and as mentioned  above, may be related to her prior medical status, and her hemoglobin should  therefore be monitored to keep hemoglobin around 9-10 while she is in the  hospital.  She will also be continued on oxygen and albuterol nebulizations.  Then she will be started on GI prophylaxis and DVT prophylaxis.  Hopefully  the patient could be mobilized in the next 24-48 hours.   Thank you very much for this consultation.                                                    Jackie Plum, M.D.    GO/MEDQ  D:  03/01/2003  T:  03/01/2003  Job:  [id number]   cc:   Molly Maduro A. Nicholos Johns, M.D.  [address]  [place]  [place] [address]  Fax: [phone]   Adolph Pollack, M.D.  [address]  [place]  [place] [address]  Fax: [phone]   Sheppard Plumber. Earlene Plater, M.D.  [address] [place]  [place] [address]  Fax: [phone]

## 2010-09-29 NOTE — Discharge Summary (Signed)
NAME:  [name], [name]                         ACCOUNT NO.:  [id number]   MEDICAL RECORD NO.:  [id number]                   PATIENT TYPE:  INP   LOCATION:  0344                                 FACILITY:  [facility]   PHYSICIAN:  Hollice Espy, M.D.            DATE OF BIRTH:  July 21, 1927   DATE OF ADMISSION:  06/24/2003  DATE OF DISCHARGE:  07/05/2003                                 DISCHARGE SUMMARY   ADDENDUM:  The patient was originally scheduled to be discharged on July 02, 2003.  There was some concern by the patient's husband that she would be  transferred so late in the evening followed by not receiving any rehab on  the weekend.  It was determined after discussion with surgery that the  patient would have discontinuation of TPN over the weekend.  The patient was  kept over the weekend for further observation.  At that time surgery was  able to discontinue TPN and start her on a clear liquid diet.  She tolerated  this and this was advanced to a soft, bland diet.  The patient is felt to be  medically stable for discharge on the morning of July 05, 2003.  She  will be transferred to the [facility] rehab home.  She will be discharged on a  soft rehab diet.  In addition, the patient was complaining of some fatigue.  This was bladder spasm secondary to her Foley.  Ditropan 2.5 mg p.o. b.i.d.  was added, which she should tolerate well, and once the patient is able to  be at more function, the Foley catheter and Ditropan can be discontinued.  In addition, she is no p.r.n. Ambien 5 mg q.h.s.  The patient complains of  being very fatigued during the day and, should this persist, I would  recommend that Ambien at night be discontinued.                                               Hollice Espy, M.D.    SKK/MEDQ  D:  07/05/2003  T:  07/05/2003  Job:  [id number]

## 2010-09-30 LAB — URINE CULTURE
Colony Count: 45000
Culture  Setup Time: 201205180055

## 2010-10-02 ENCOUNTER — Encounter (HOSPITAL_COMMUNITY): Payer: Medicare Other | Admitting: Psychiatry

## 2010-10-02 DIAGNOSIS — F3189 Other bipolar disorder: Secondary | ICD-10-CM

## 2010-10-17 ENCOUNTER — Other Ambulatory Visit: Payer: Self-pay | Admitting: Oncology

## 2010-10-17 ENCOUNTER — Encounter (HOSPITAL_BASED_OUTPATIENT_CLINIC_OR_DEPARTMENT_OTHER): Payer: Medicare Other | Admitting: Oncology

## 2010-10-17 DIAGNOSIS — R300 Dysuria: Secondary | ICD-10-CM

## 2010-10-17 DIAGNOSIS — F319 Bipolar disorder, unspecified: Secondary | ICD-10-CM

## 2010-10-17 DIAGNOSIS — Z85038 Personal history of other malignant neoplasm of large intestine: Secondary | ICD-10-CM

## 2010-10-17 DIAGNOSIS — C9210 Chronic myeloid leukemia, BCR/ABL-positive, not having achieved remission: Secondary | ICD-10-CM

## 2010-10-17 LAB — CBC WITH DIFFERENTIAL/PLATELET
BASO%: 0.1 % (ref 0.0–2.0)
EOS%: 0.5 % (ref 0.0–7.0)
LYMPH%: 12.2 % — ABNORMAL LOW (ref 14.0–49.7)
MCH: 33.1 pg (ref 25.1–34.0)
MCHC: 34.3 g/dL (ref 31.5–36.0)
MONO#: 0.4 10*3/uL (ref 0.1–0.9)
RBC: 4.09 10*6/uL (ref 3.70–5.45)
WBC: 5.4 10*3/uL (ref 3.9–10.3)
lymph#: 0.7 10*3/uL — ABNORMAL LOW (ref 0.9–3.3)

## 2010-11-30 ENCOUNTER — Emergency Department (HOSPITAL_COMMUNITY)
Admission: EM | Admit: 2010-11-30 | Discharge: 2010-11-30 | Disposition: A | Discharge status: Home / Self Care | Payer: Medicare Other | Attending: Emergency Medicine | Admitting: Emergency Medicine

## 2010-11-30 ENCOUNTER — Encounter (HOSPITAL_COMMUNITY): Payer: Self-pay | Admitting: Radiology

## 2010-11-30 ENCOUNTER — Emergency Department (HOSPITAL_COMMUNITY): Payer: Medicare Other

## 2010-11-30 DIAGNOSIS — F319 Bipolar disorder, unspecified: Secondary | ICD-10-CM | POA: Insufficient documentation

## 2010-11-30 DIAGNOSIS — Z79899 Other long term (current) drug therapy: Secondary | ICD-10-CM | POA: Insufficient documentation

## 2010-11-30 DIAGNOSIS — R0602 Shortness of breath: Secondary | ICD-10-CM | POA: Insufficient documentation

## 2010-11-30 DIAGNOSIS — R000 Tachycardia, unspecified: Secondary | ICD-10-CM | POA: Insufficient documentation

## 2010-11-30 DIAGNOSIS — R109 Unspecified abdominal pain: Secondary | ICD-10-CM | POA: Insufficient documentation

## 2010-11-30 DIAGNOSIS — IMO0002 Reserved for concepts with insufficient information to code with codable children: Secondary | ICD-10-CM | POA: Insufficient documentation

## 2010-11-30 DIAGNOSIS — N200 Calculus of kidney: Secondary | ICD-10-CM | POA: Insufficient documentation

## 2010-11-30 DIAGNOSIS — R404 Transient alteration of awareness: Secondary | ICD-10-CM | POA: Insufficient documentation

## 2010-11-30 DIAGNOSIS — K5730 Diverticulosis of large intestine without perforation or abscess without bleeding: Secondary | ICD-10-CM | POA: Insufficient documentation

## 2010-11-30 DIAGNOSIS — N390 Urinary tract infection, site not specified: Secondary | ICD-10-CM | POA: Insufficient documentation

## 2010-11-30 DIAGNOSIS — I10 Essential (primary) hypertension: Secondary | ICD-10-CM | POA: Insufficient documentation

## 2010-11-30 DIAGNOSIS — E039 Hypothyroidism, unspecified: Secondary | ICD-10-CM | POA: Insufficient documentation

## 2010-11-30 DIAGNOSIS — Z85038 Personal history of other malignant neoplasm of large intestine: Secondary | ICD-10-CM | POA: Insufficient documentation

## 2010-11-30 DIAGNOSIS — F411 Generalized anxiety disorder: Secondary | ICD-10-CM | POA: Insufficient documentation

## 2010-11-30 HISTORY — DX: Malignant (primary) neoplasm, unspecified: C80.1

## 2010-11-30 LAB — URINALYSIS, ROUTINE W REFLEX MICROSCOPIC
Nitrite: NEGATIVE
Protein, ur: NEGATIVE mg/dL
Urobilinogen, UA: 0.2 mg/dL (ref 0.0–1.0)

## 2010-11-30 LAB — COMPREHENSIVE METABOLIC PANEL
ALT: 20 U/L (ref 0–35)
AST: 28 U/L (ref 0–37)
Albumin: 4 g/dL (ref 3.5–5.2)
CO2: 27 mEq/L (ref 19–32)
Calcium: 10.1 mg/dL (ref 8.4–10.5)
Creatinine, Ser: 0.7 mg/dL (ref 0.50–1.10)
GFR calc non Af Amer: >60 mL/min (ref >60)
Sodium: 141 mEq/L (ref 135–145)
Total Protein: 7.1 g/dL (ref 6.0–8.3)

## 2010-11-30 LAB — URINE MICROSCOPIC-ADD ON

## 2010-11-30 LAB — TROPONIN I: Troponin I: <0.3 ng/mL (ref <0.30)

## 2010-11-30 LAB — GLUCOSE, CAPILLARY
Glucose-Capillary: 176 mg/dL — ABNORMAL HIGH (ref 70–99)
Glucose-Capillary: 113 mg/dL — ABNORMAL HIGH (ref 70–99)

## 2010-11-30 LAB — DIFFERENTIAL
Basophils Absolute: 0 10*3/uL (ref 0.0–0.1)
Basophils Relative: 0 % (ref 0–1)
Eosinophils Relative: 2 % (ref 0–5)
Lymphocytes Relative: 22 % (ref 12–46)
Neutro Abs: 2.9 10*3/uL (ref 1.7–7.7)

## 2010-11-30 LAB — RAPID URINE DRUG SCREEN, HOSP PERFORMED
Amphetamines: NOT DETECTED
Barbiturates: NOT DETECTED
Opiates: POSITIVE — AB
Tetrahydrocannabinol: NOT DETECTED

## 2010-11-30 LAB — CBC
HCT: 38.8 % (ref 36.0–46.0)
RDW: 13.6 % (ref 11.5–15.5)
WBC: 4.5 10*3/uL (ref 4.0–10.5)

## 2010-11-30 LAB — LIPASE, BLOOD: Lipase: 38 U/L (ref 11–59)

## 2010-11-30 LAB — AMMONIA: Ammonia: 25 umol/L (ref 11–60)

## 2010-11-30 LAB — SALICYLATE LEVEL: Salicylate Lvl: <2 mg/dL — ABNORMAL LOW (ref 2.8–20.0)

## 2010-12-03 LAB — URINE CULTURE: Colony Count: 75000

## 2010-12-20 ENCOUNTER — Encounter (HOSPITAL_COMMUNITY): Payer: Medicare Other | Admitting: Psychiatry

## 2011-01-03 ENCOUNTER — Encounter (INDEPENDENT_AMBULATORY_CARE_PROVIDER_SITE_OTHER): Payer: Medicare Other | Admitting: Psychiatry

## 2011-01-03 DIAGNOSIS — F3189 Other bipolar disorder: Secondary | ICD-10-CM

## 2011-01-30 ENCOUNTER — Other Ambulatory Visit: Payer: Self-pay | Admitting: Oncology

## 2011-01-30 ENCOUNTER — Encounter (HOSPITAL_BASED_OUTPATIENT_CLINIC_OR_DEPARTMENT_OTHER): Payer: Medicare Other | Admitting: Oncology

## 2011-01-30 DIAGNOSIS — F319 Bipolar disorder, unspecified: Secondary | ICD-10-CM

## 2011-01-30 DIAGNOSIS — R300 Dysuria: Secondary | ICD-10-CM

## 2011-01-30 DIAGNOSIS — Z85038 Personal history of other malignant neoplasm of large intestine: Secondary | ICD-10-CM

## 2011-01-30 DIAGNOSIS — D649 Anemia, unspecified: Secondary | ICD-10-CM

## 2011-01-30 LAB — COMPREHENSIVE METABOLIC PANEL WITH GFR
ALT: 27 U/L (ref 0–35)
AST: 35 U/L (ref 0–37)
Albumin: 4.5 g/dL (ref 3.5–5.2)
Alkaline Phosphatase: 67 U/L (ref 39–117)
BUN: 16 mg/dL (ref 6–23)
CO2: 25 meq/L (ref 19–32)
Calcium: 9.1 mg/dL (ref 8.4–10.5)
Chloride: 106 meq/L (ref 96–112)
Creatinine, Ser: 0.75 mg/dL (ref 0.50–1.10)
Glucose, Bld: 99 mg/dL (ref 70–99)
Potassium: 4.1 meq/L (ref 3.5–5.3)
Sodium: 141 meq/L (ref 135–145)
Total Bilirubin: 0.6 mg/dL (ref 0.3–1.2)
Total Protein: 7 g/dL (ref 6.0–8.3)

## 2011-01-30 LAB — CBC WITH DIFFERENTIAL/PLATELET
BASO%: 0.5 % (ref 0.0–2.0)
Basophils Absolute: 0 10e3/uL (ref 0.0–0.1)
EOS%: 1.2 % (ref 0.0–7.0)
Eosinophils Absolute: 0.1 10e3/uL (ref 0.0–0.5)
HCT: 38 % (ref 34.8–46.6)
HGB: 13.3 g/dL (ref 11.6–15.9)
LYMPH%: 20.1 % (ref 14.0–49.7)
MCH: 34 pg (ref 25.1–34.0)
MCHC: 35 g/dL (ref 31.5–36.0)
MCV: 97.4 fL (ref 79.5–101.0)
MONO#: 0.3 10e3/uL (ref 0.1–0.9)
MONO%: 6.2 % (ref 0.0–14.0)
NEUT#: 3.9 10e3/uL (ref 1.5–6.5)
NEUT%: 72 % (ref 38.4–76.8)
Platelets: 260 10e3/uL (ref 145–400)
RBC: 3.9 10e6/uL (ref 3.70–5.45)
RDW: 13.1 % (ref 11.2–14.5)
WBC: 5.4 10e3/uL (ref 3.9–10.3)
lymph#: 1.1 10e3/uL (ref 0.9–3.3)

## 2011-01-31 ENCOUNTER — Encounter (HOSPITAL_COMMUNITY): Payer: Medicare Other | Admitting: Psychiatry

## 2011-02-01 LAB — CREATININE, SERUM
Creatinine, Ser: 0.68
GFR calc Af Amer: >60
GFR calc non Af Amer: >60

## 2011-03-06 ENCOUNTER — Other Ambulatory Visit: Payer: Self-pay | Admitting: Oncology

## 2011-03-06 ENCOUNTER — Encounter (HOSPITAL_BASED_OUTPATIENT_CLINIC_OR_DEPARTMENT_OTHER): Payer: Medicare Other | Admitting: Oncology

## 2011-03-06 DIAGNOSIS — R300 Dysuria: Secondary | ICD-10-CM

## 2011-03-06 DIAGNOSIS — F319 Bipolar disorder, unspecified: Secondary | ICD-10-CM

## 2011-03-06 DIAGNOSIS — C9210 Chronic myeloid leukemia, BCR/ABL-positive, not having achieved remission: Secondary | ICD-10-CM

## 2011-03-06 DIAGNOSIS — Z85038 Personal history of other malignant neoplasm of large intestine: Secondary | ICD-10-CM

## 2011-03-06 LAB — COMPREHENSIVE METABOLIC PANEL
ALT: 17 U/L (ref 0–35)
AST: 22 U/L (ref 0–37)
Albumin: 3.9 g/dL (ref 3.5–5.2)
BUN: 16 mg/dL (ref 6–23)
CO2: 29 mEq/L (ref 19–32)
Calcium: 9.6 mg/dL (ref 8.4–10.5)
Chloride: 103 mEq/L (ref 96–112)
Potassium: 3.9 mEq/L (ref 3.5–5.3)

## 2011-03-06 LAB — CBC WITH DIFFERENTIAL/PLATELET
BASO%: 0.6 % (ref 0.0–2.0)
LYMPH%: 21.5 % (ref 14.0–49.7)
MCHC: 34 g/dL (ref 31.5–36.0)
MONO#: 0.4 10*3/uL (ref 0.1–0.9)
MONO%: 7.5 % (ref 0.0–14.0)
Platelets: 243 10*3/uL (ref 145–400)
RBC: 4.08 10*6/uL (ref 3.70–5.45)
WBC: 5.1 10*3/uL (ref 3.9–10.3)

## 2011-03-22 ENCOUNTER — Encounter (HOSPITAL_COMMUNITY): Payer: Self-pay | Admitting: Psychology

## 2011-03-22 DIAGNOSIS — F39 Unspecified mood [affective] disorder: Secondary | ICD-10-CM | POA: Insufficient documentation

## 2011-03-26 ENCOUNTER — Telehealth (HOSPITAL_COMMUNITY): Payer: Self-pay

## 2011-03-26 ENCOUNTER — Telehealth: Payer: Self-pay | Admitting: Oncology

## 2011-03-26 NOTE — Telephone Encounter (Signed)
Faxed PA request to [facility]  Twice.  03/26/11 Approval from [facility].  It has been approved from 03/23/11 until 03/22/12 [id number].  egs

## 2011-03-28 ENCOUNTER — Ambulatory Visit (HOSPITAL_COMMUNITY): Payer: Medicare Other | Admitting: Psychiatry

## 2011-03-30 ENCOUNTER — Telehealth: Payer: Self-pay | Admitting: *Deleted

## 2011-03-30 NOTE — Telephone Encounter (Signed)
Call from [facility]. Pt canceled appt. with Dr. Randa Evens. She stated she would call their office when she could reschedule.

## 2011-05-12 ENCOUNTER — Telehealth: Payer: Self-pay | Admitting: Oncology

## 2011-05-12 ENCOUNTER — Other Ambulatory Visit (HOSPITAL_COMMUNITY): Payer: Self-pay | Admitting: Psychiatry

## 2011-05-12 DIAGNOSIS — F39 Unspecified mood [affective] disorder: Secondary | ICD-10-CM

## 2011-05-12 NOTE — Telephone Encounter (Signed)
Talked to pt, gave her appt for January 24th, she is aware of appt for lab and MD

## 2011-05-16 ENCOUNTER — Other Ambulatory Visit (HOSPITAL_COMMUNITY): Payer: Self-pay | Admitting: Psychiatry

## 2011-05-16 ENCOUNTER — Encounter (HOSPITAL_COMMUNITY): Payer: Self-pay | Admitting: *Deleted

## 2011-05-16 NOTE — Progress Notes (Signed)
RX for Trileptal 300mg : 1 tablet po q AM and 2 tablets po q HS was refilled 05/14/11 in printed format. Medication was called to pharmacy of record on 05/17/11.

## 2011-06-05 ENCOUNTER — Telehealth: Payer: Self-pay | Admitting: *Deleted

## 2011-06-05 NOTE — Telephone Encounter (Signed)
Please cancel 1/24 visit--patient's husband died and they need to delay visit.

## 2011-06-06 ENCOUNTER — Other Ambulatory Visit: Payer: Self-pay | Admitting: *Deleted

## 2011-06-06 ENCOUNTER — Telehealth: Payer: Self-pay | Admitting: Oncology

## 2011-06-06 NOTE — Telephone Encounter (Signed)
called pt lmovm that her appt for 01-24 wac r/s to 03-01.  asked pt to rtn call to confirm appt

## 2011-06-07 ENCOUNTER — Other Ambulatory Visit: Payer: Medicare Other | Admitting: Lab

## 2011-06-07 ENCOUNTER — Ambulatory Visit: Payer: Medicare Other | Admitting: Oncology

## 2011-06-15 ENCOUNTER — Other Ambulatory Visit (HOSPITAL_COMMUNITY): Payer: Self-pay | Admitting: Psychiatry

## 2011-06-15 DIAGNOSIS — F331 Major depressive disorder, recurrent, moderate: Secondary | ICD-10-CM

## 2011-06-25 ENCOUNTER — Other Ambulatory Visit (HOSPITAL_COMMUNITY): Payer: Self-pay | Admitting: Psychiatry

## 2011-07-04 ENCOUNTER — Encounter (HOSPITAL_COMMUNITY): Payer: Self-pay | Admitting: Psychiatry

## 2011-07-04 ENCOUNTER — Ambulatory Visit (INDEPENDENT_AMBULATORY_CARE_PROVIDER_SITE_OTHER): Payer: Medicare Other | Admitting: Psychiatry

## 2011-07-04 VITALS — BP 112/68 | HR 78 | Wt 156.0 lb

## 2011-07-04 DIAGNOSIS — F331 Major depressive disorder, recurrent, moderate: Secondary | ICD-10-CM

## 2011-07-04 MED ORDER — OXCARBAZEPINE 300 MG PO TABS: ORAL_TABLET | ORAL | Status: DC

## 2011-07-04 MED ORDER — PAROXETINE HCL 20 MG PO TABS: 20.0000 mg | ORAL_TABLET | Freq: Every day | ORAL | Status: DC

## 2011-07-04 NOTE — Progress Notes (Addendum)
Chief complaint I lost my husband in 06/15/2022  History of present illness Patient is an 78 year old Caucasian recently widowed female who came with her son for appointment. Patient has been seen in this office since April 2008. Patient has long history of psychiatric illness. Her last visit with [place] was August 2012. Patient told her husband died in 2022-06-15 due to stage IV melanoma. He was sick for past few months. Patient admitted she misses her husband as he was very involved in her life. Patient always come on her appointment with her husband. Patient admitted getting frustrated when she is unable to do things. Patient's son is not very involved in patient care. He has been taking her mother to various appointments with doctors. Patient admitted recently she has difficulty concentrating and getting easily irritable. Though she denies any episodes of agitation or anger but admitted easily frustrated. She has been compliant with her medication most of the time. She takes Trileptal 300 mg in the morning and 600 mg at bedtime, Paxil 20 mg daily and Klonopin as needed. She denies any side effects of medication. She admitted having one grief counseling session but not sure if it was enough. She admitted having tearful eyes and getting emotional. She is sleeping 6-7 hours.  Past psychiatric history Patient has long history of psychiatric illness. She was diagnosed with postpartum depression 55 years ago. She has multiple psychiatric inpatient treatment. Most of her psychiatric admission was due to her agitation and mood swings. She has seen in the past but Dr. Sharyon Medicus, Leanor Rubenstein, Dr. Alyse Low. [facility] In the past she had tried Wellbutrin Depakote with limited response. She does not want to talk about her past psychiatric history.  Medical history Patient has history of colon cancer, seizure due to withdrawals of benzodiazepine,  hypertension, hypothyroidism and GERD.  Family history Patient denies any family history of psychiatric illness  Alcohol and substance use Patient denies any history of alcohol and substance use  Psychosocial history Patient was born and raised in [place]. She moved to [place] with her husband due to his job. The patient has one son who now more involved in her care. Her husband died last month due to melanoma.  Mental status examination Patient is casually dressed and fairly groomed. She uses wheelchair for ambulation. She is easily irritable and distracted. She maintained fair eye contact. She denies any active or passive suicidal thoughts or homicidal thoughts. She denies any auditory or visual hallucination. She described her mood is tired and sad and her affect is constricted. At times it is difficult to engage in conversation. She's alert and oriented x3. She gets easily frustrated with questions. There are no tremors or shakes present. Her insight judgment and impulse control is fair.  Assessment Axis I mood disorder NOS, bipolar disorder, grief Axis II deferred Axis III see medical history Axis IV moderate  Plan I talked to the patient and her son in detail about the grief process and also need for counseling to increase coping and social skills. Patient was initially reluctant but then agreed to see a counselor. I have reviewed last labwork which was done 4 months ago shows normal CBC and mild elevation of glucose. Patient do not remember her medication. Son did not bring the list of medication however he will fax [place] to complete list of medication with dosage. I will continue current medication, at this time patient reported no side effects  of medication. I will see her again in 6 weeks.  Time spent 30 minutes

## 2011-07-06 ENCOUNTER — Other Ambulatory Visit (HOSPITAL_COMMUNITY): Payer: Self-pay | Admitting: Psychology

## 2011-07-06 MED ORDER — CLONAZEPAM 0.5 MG PO TABS: 0.5000 mg | ORAL_TABLET | ORAL | Status: DC

## 2011-07-12 ENCOUNTER — Other Ambulatory Visit: Payer: Self-pay | Admitting: *Deleted

## 2011-07-12 DIAGNOSIS — C9210 Chronic myeloid leukemia, BCR/ABL-positive, not having achieved remission: Secondary | ICD-10-CM

## 2011-07-13 ENCOUNTER — Ambulatory Visit: Payer: Medicare Other | Admitting: Oncology

## 2011-07-13 ENCOUNTER — Other Ambulatory Visit: Payer: Medicare Other | Admitting: Lab

## 2011-07-18 ENCOUNTER — Other Ambulatory Visit: Payer: Self-pay | Admitting: *Deleted

## 2011-07-23 ENCOUNTER — Ambulatory Visit (INDEPENDENT_AMBULATORY_CARE_PROVIDER_SITE_OTHER): Payer: Medicare Other | Admitting: Licensed Clinical Social Worker

## 2011-07-23 ENCOUNTER — Encounter (HOSPITAL_COMMUNITY): Payer: Self-pay | Admitting: Licensed Clinical Social Worker

## 2011-07-23 DIAGNOSIS — F332 Major depressive disorder, recurrent severe without psychotic features: Secondary | ICD-10-CM
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
## 2011-07-25 ENCOUNTER — Telehealth: Payer: Self-pay | Admitting: Oncology

## 2011-07-25 NOTE — Telephone Encounter (Signed)
called pts son lmopvm for appt on 04/26 and to rtn call to confirm appt

## 2011-07-27 ENCOUNTER — Telehealth: Payer: Self-pay | Admitting: *Deleted

## 2011-07-27 NOTE — Telephone Encounter (Signed)
Call from pt returning call for appts. Gave 09/07/11 appts. Pt verbalized understanding.

## 2011-08-07 ENCOUNTER — Other Ambulatory Visit: Payer: Self-pay | Admitting: *Deleted

## 2011-08-07 DIAGNOSIS — C9210 Chronic myeloid leukemia, BCR/ABL-positive, not having achieved remission: Secondary | ICD-10-CM

## 2011-08-07 MED ORDER — IMATINIB MESYLATE 100 MG PO TABS: 300.0000 mg | ORAL_TABLET | Freq: Every day | ORAL | Status: DC

## 2011-08-07 NOTE — Telephone Encounter (Signed)
VM patient is out of medication and needs refill. Has not been seen in several months and follow up not until 09/07/11. Will approve 30 day refill.

## 2011-08-15 ENCOUNTER — Encounter (HOSPITAL_COMMUNITY): Payer: Self-pay | Admitting: Psychiatry

## 2011-08-15 ENCOUNTER — Ambulatory Visit (INDEPENDENT_AMBULATORY_CARE_PROVIDER_SITE_OTHER): Payer: Medicare Other | Admitting: Psychiatry

## 2011-08-15 DIAGNOSIS — F319 Bipolar disorder, unspecified: Secondary | ICD-10-CM

## 2011-08-15 NOTE — Progress Notes (Signed)
Chief complaint Medication management and followup.    History of present illness Patient is an 78 year old Caucasian recently widowed female who came with her family friend .  Her son was busy and cannot come alm with her on this appointment .  Patient has seen one time with a therapist and refuse to make any more appointment .  Patient told she does not want counseling .  She still endorse depression , frustration and difficulty concentrating on daily projects .  She endorse her current medicine working and does not want to change.  She endorsed low motivation to do things .  She was offered to get counseling from hospice but patient refused .  She reported her son is very helpful and supportive and take care of her medication refills .  She sleeping fine .  She denies any side effects of medication.  She still misses her husband but does not want to talk about him .  Patient endorse that her family friend who came with her appointment is very helpful and she usually talks to her daily .  She is very upset on her endocrinologist who was still not able to fix her thyroid problem.  She reported that she does not want to see the Dr. but she has no other choice .  Patient admitted episodes of anger and irritability her overall she reported her mood has been stable .  She takes Trileptal 300 mg in the morning and 600 mg at bedtime, Paxil 20 mg daily and Klonopin as needed. She denies any side effects of medication.  Patient reported that she need refill of her Paxil.  Current psychiatric medication Paxil 20 mg daily Trileptal 300 mg 1 in the morning and 2 at bedtime  Past psychiatric history Patient has long history of psychiatric illness. She was diagnosed with postpartum depression 55 years ago. She has multiple psychiatric inpatient treatment. Most of her psychiatric admission was due to her agitation and mood swings. She has seen in the past but Dr. Sharyon Medicus, Leanor Rubenstein, Dr. Alyse Low. She also has  [facility]. In the past she had tried Wellbutrin Depakote with limited response. She does not want to talk about her past psychiatric history.  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  Family history Patient denies any family history of psychiatric illness  Alcohol and substance use Patient denies any history of alcohol and substance use  Psychosocial history Patient was born and raised in [place]. She moved to [place] with her husband due to his job. The patient has one son who now more involved in her care. Her husband died due to melanoma.  Mental status examination Patient is casually dressed and fairly groomed. She uses wheelchair for ambulation. She is easily irritable and distracted. She maintained fair eye contact. She denies any active or passive suicidal thoughts or homicidal thoughts. She denies any auditory or visual hallucination. She described her mood is tired and sad and her affect is constricted. At times it is difficult to engage in conversation. She's alert and oriented x3. She gets easily frustrated with questions. There are no tremors or shakes present. Her insight judgment and impulse control is fair.  Assessment Axis I mood disorder NOS, bipolar disorder, grief Axis II deferred Axis III see medical history Axis IV moderate  Plan I spoke to her son on phone about her refill status.  Patient was given 90 days prescription on her last visit and explain she should not be out of her medication at this time.  Recommended to contact pharmacy if patient has given less than 90 days of supply.  Recommend to call us if patient has any question or concern about the medication.  I will continue Paxil and Trileptal.  I recommend to call for refills of her prescription.  I will see her again in 3 months.  Patient does not  want any counseling at this time. l see her again in 6 weeks.

## 2011-09-05 ENCOUNTER — Other Ambulatory Visit: Payer: Self-pay | Admitting: *Deleted

## 2011-09-05 ENCOUNTER — Telehealth: Payer: Self-pay | Admitting: *Deleted

## 2011-09-05 DIAGNOSIS — C9210 Chronic myeloid leukemia, BCR/ABL-positive, not having achieved remission: Secondary | ICD-10-CM

## 2011-09-05 MED ORDER — IMATINIB MESYLATE 100 MG PO TABS: 300.0000 mg | ORAL_TABLET | Freq: Every day | ORAL | Status: DC

## 2011-09-05 NOTE — Telephone Encounter (Deleted)
Close encounter 

## 2011-09-05 NOTE — Telephone Encounter (Signed)
Close encounter 

## 2011-09-07 ENCOUNTER — Other Ambulatory Visit (HOSPITAL_BASED_OUTPATIENT_CLINIC_OR_DEPARTMENT_OTHER): Payer: Medicare Other | Admitting: Lab

## 2011-09-07 ENCOUNTER — Ambulatory Visit (HOSPITAL_BASED_OUTPATIENT_CLINIC_OR_DEPARTMENT_OTHER): Payer: Medicare Other | Admitting: Oncology

## 2011-09-07 ENCOUNTER — Telehealth: Payer: Self-pay | Admitting: Oncology

## 2011-09-07 DIAGNOSIS — F319 Bipolar disorder, unspecified: Secondary | ICD-10-CM

## 2011-09-07 DIAGNOSIS — R300 Dysuria: Secondary | ICD-10-CM

## 2011-09-07 DIAGNOSIS — R21 Rash and other nonspecific skin eruption: Secondary | ICD-10-CM

## 2011-09-07 DIAGNOSIS — C9210 Chronic myeloid leukemia, BCR/ABL-positive, not having achieved remission: Secondary | ICD-10-CM

## 2011-09-07 DIAGNOSIS — Z85038 Personal history of other malignant neoplasm of large intestine: Secondary | ICD-10-CM

## 2011-09-07 LAB — CBC WITH DIFFERENTIAL/PLATELET
Basophils Absolute: 0.1 10*3/uL (ref 0.0–0.1)
EOS%: 1.6 % (ref 0.0–7.0)
Eosinophils Absolute: 0.1 10*3/uL (ref 0.0–0.5)
HCT: 39.3 % (ref 34.8–46.6)
HGB: 13.2 g/dL (ref 11.6–15.9)
MCH: 32.4 pg (ref 25.1–34.0)
MONO#: 0.4 10*3/uL (ref 0.1–0.9)
NEUT#: 4.3 10*3/uL (ref 1.5–6.5)
NEUT%: 75.8 % (ref 38.4–76.8)
RDW: 13.2 % (ref 11.2–14.5)
lymph#: 0.8 10*3/uL — ABNORMAL LOW (ref 0.9–3.3)

## 2011-09-07 LAB — COMPREHENSIVE METABOLIC PANEL
ALT: 21 U/L (ref 0–35)
AST: 21 U/L (ref 0–37)
BUN: 36 mg/dL — ABNORMAL HIGH (ref 6–23)
CO2: 25 mEq/L (ref 19–32)
Calcium: 9.4 mg/dL (ref 8.4–10.5)
Creatinine, Ser: 0.77 mg/dL (ref 0.50–1.10)
Total Bilirubin: 0.4 mg/dL (ref 0.3–1.2)

## 2011-09-07 NOTE — Progress Notes (Signed)
OFFICE PROGRESS NOTE   INTERVAL HISTORY:   She returns as scheduled. She complains of alopecia. There is "dry "skin. There is a rash at the left thigh. No edema or diarrhea. She is currently taking Gleevec at a dose of 200 mg daily. This is lower than the prescribed 300 mg daily dose.  Objective:  Vital signs in last 24 hours:  Blood pressure 153/80, pulse 81, temperature 97.8 F (36.6 C), temperature source Oral, height 5\' 5"  (1.651 m), weight 169 lb (76.658 kg).    HEENT: No thrush or ulcers Resp: Lungs clear bilaterally Cardio: Regular rate and rhythm GI: No hepatosplenomegaly Vascular: No leg or periorbital edema, the left lower leg is slightly larger than the right side  Skin: There are a few areas of plaque-like erythema with abrasions at the left anterior thigh, these appear to be resolving    Lab Results:  Lab Results  Component Value Date   WBC 5.7 09/07/2011   HGB 13.2 09/07/2011   HCT 39.3 09/07/2011   MCV 96.1 09/07/2011   PLT 249 09/07/2011   ANC 4.3   Medications: I have reviewed the patient's current medications.  Assessment/Plan: 1. Chronic phase chronic myelogenous leukemia.  She remains in hematologic remission.  The peripheral blood PCR was stable on 01/30/2011. 2. History of stage III colon cancer, diagnosed in 2004.   3. Bipolar disease. 4. Dysuria, followed by Dr. Annabell Howells. 5. "Neuropathy," followed by Dr. Sandria Manly.   Disposition:  She remains in hematologic remission from the CML. We will followup on the peripheral blood PCR from today. I encouraged her to take Gleevec at the prescribed 300 mg daily dose. She will return for a lab visit in 3 months. She is scheduled for a 6 month office visit.  The mild rash at the left anterior thigh appears to be resolving. This is most likely unrelated to Gleevec.   Thornton Papas, MD  09/07/2011  1:30 PM

## 2011-09-07 NOTE — Telephone Encounter (Signed)
called pt and provided her with appts in [id number]

## 2011-09-17 LAB — BCR/ABL

## 2011-09-19 ENCOUNTER — Telehealth: Payer: Self-pay | Admitting: *Deleted

## 2011-09-19 NOTE — Telephone Encounter (Signed)
Called pt, she reports she had colonoscopy this year with Dr. Randa Evens. Called [facility], requested copy be faxed to office.

## 2011-10-22 ENCOUNTER — Encounter: Payer: Self-pay | Admitting: Family Medicine

## 2011-11-03 ENCOUNTER — Other Ambulatory Visit (HOSPITAL_COMMUNITY): Payer: Self-pay | Admitting: Psychiatry

## 2011-11-05 ENCOUNTER — Other Ambulatory Visit (HOSPITAL_COMMUNITY): Payer: Self-pay | Admitting: Psychiatry

## 2011-11-09 ENCOUNTER — Telehealth (HOSPITAL_COMMUNITY): Payer: Self-pay

## 2011-11-12 ENCOUNTER — Ambulatory Visit (HOSPITAL_COMMUNITY): Payer: Self-pay | Admitting: Psychiatry

## 2011-11-19 ENCOUNTER — Other Ambulatory Visit (HOSPITAL_COMMUNITY): Payer: Self-pay | Admitting: Psychiatry

## 2011-11-19 NOTE — Telephone Encounter (Signed)
New prescription given on 11/05/11 for 90 days.

## 2011-11-20 ENCOUNTER — Encounter (HOSPITAL_COMMUNITY): Payer: Self-pay | Admitting: Emergency Medicine

## 2011-11-20 ENCOUNTER — Emergency Department (HOSPITAL_COMMUNITY): Payer: Medicare Other

## 2011-11-20 ENCOUNTER — Emergency Department (HOSPITAL_COMMUNITY)
Admission: EM | Admit: 2011-11-20 | Discharge: 2011-11-21 | Disposition: A | Discharge status: Home / Self Care | Payer: Medicare Other | Attending: Emergency Medicine | Admitting: Emergency Medicine

## 2011-11-20 ENCOUNTER — Ambulatory Visit (INDEPENDENT_AMBULATORY_CARE_PROVIDER_SITE_OTHER): Payer: Medicare Other | Admitting: Emergency Medicine

## 2011-11-20 VITALS — BP 110/70 | HR 80 | Temp 98.8°F | Resp 16 | Ht 63.5 in | Wt 171.0 lb

## 2011-11-20 DIAGNOSIS — R079 Chest pain, unspecified: Secondary | ICD-10-CM | POA: Insufficient documentation

## 2011-11-20 DIAGNOSIS — Z85038 Personal history of other malignant neoplasm of large intestine: Secondary | ICD-10-CM | POA: Insufficient documentation

## 2011-11-20 DIAGNOSIS — R0602 Shortness of breath: Secondary | ICD-10-CM | POA: Insufficient documentation

## 2011-11-20 DIAGNOSIS — I10 Essential (primary) hypertension: Secondary | ICD-10-CM | POA: Insufficient documentation

## 2011-11-20 DIAGNOSIS — M79609 Pain in unspecified limb: Secondary | ICD-10-CM | POA: Insufficient documentation

## 2011-11-20 DIAGNOSIS — Z79899 Other long term (current) drug therapy: Secondary | ICD-10-CM | POA: Insufficient documentation

## 2011-11-20 DIAGNOSIS — G8929 Other chronic pain: Secondary | ICD-10-CM | POA: Insufficient documentation

## 2011-11-20 DIAGNOSIS — K219 Gastro-esophageal reflux disease without esophagitis: Secondary | ICD-10-CM | POA: Insufficient documentation

## 2011-11-20 DIAGNOSIS — I447 Left bundle-branch block, unspecified: Secondary | ICD-10-CM

## 2011-11-20 DIAGNOSIS — I200 Unstable angina: Secondary | ICD-10-CM

## 2011-11-20 DIAGNOSIS — F3130 Bipolar disorder, current episode depressed, mild or moderate severity, unspecified: Secondary | ICD-10-CM | POA: Insufficient documentation

## 2011-11-20 HISTORY — DX: Bipolar disorder, unspecified: F31.9

## 2011-11-20 LAB — COMPREHENSIVE METABOLIC PANEL
ALT: 18 U/L (ref 0–35)
AST: 21 U/L (ref 0–37)
Albumin: 3.6 g/dL (ref 3.5–5.2)
CO2: 26 mEq/L (ref 19–32)
Calcium: 9.2 mg/dL (ref 8.4–10.5)
Chloride: 103 mEq/L (ref 96–112)
Creatinine, Ser: 0.65 mg/dL (ref 0.50–1.10)
Sodium: 139 mEq/L (ref 135–145)
Total Bilirubin: 0.3 mg/dL (ref 0.3–1.2)

## 2011-11-20 LAB — POCT I-STAT TROPONIN I
Troponin i, poc: 0.01 ng/mL (ref 0.00–0.08)
Troponin i, poc: 0.01 ng/mL (ref 0.00–0.08)

## 2011-11-20 LAB — CBC
MCV: 94.9 fL (ref 78.0–100.0)
Platelets: 208 10*3/uL (ref 150–400)
RBC: 3.74 MIL/uL — ABNORMAL LOW (ref 3.87–5.11)
RDW: 13.4 % (ref 11.5–15.5)
WBC: 5.4 10*3/uL (ref 4.0–10.5)

## 2011-11-20 LAB — APTT: aPTT: 28 seconds (ref 24–37)

## 2011-11-20 LAB — PROTIME-INR: INR: 1.12 (ref 0.00–1.49)

## 2011-11-20 MED ORDER — GI COCKTAIL ~~LOC~~
30.0000 mL | Freq: Once | ORAL | Status: AC
  Administered 2011-11-20: 30 mL via ORAL
  Filled 2011-11-20: qty 30

## 2011-11-20 MED ORDER — SODIUM CHLORIDE 0.9 % IV SOLN
1000.0000 mL | INTRAVENOUS | Status: DC
  Administered 2011-11-20: 1000 mL via INTRAVENOUS

## 2011-11-20 MED ORDER — ASPIRIN 325 MG PO TABS: 325.0000 mg | ORAL_TABLET | Freq: Every day | ORAL | Status: DC

## 2011-11-20 MED ORDER — OMEPRAZOLE 20 MG PO CPDR: 20.0000 mg | DELAYED_RELEASE_CAPSULE | Freq: Every day | ORAL | Status: DC

## 2011-11-20 MED ORDER — NITROGLYCERIN 0.3 MG SL SUBL: 0.4000 mg | SUBLINGUAL_TABLET | SUBLINGUAL | Status: DC | PRN

## 2011-11-20 NOTE — Progress Notes (Signed)
Subjective:    Patient ID: [name], female    DOB: [birth year], 78 y.o.   MRN: [id number]  Chest Pain  This is a recurrent problem. The current episode started in the past 7 days. The onset quality is undetermined. The problem occurs constantly. The problem has been waxing and waning. The pain is present in the substernal region. The pain is moderate. The quality of the pain is described as pressure. The pain does not radiate. Associated symptoms include shortness of breath. Pertinent negatives include no abdominal pain, back pain, claudication, cough, diaphoresis, dizziness, exertional chest pressure, fever, headaches, hemoptysis, irregular heartbeat, leg pain, lower extremity edema, malaise/fatigue, nausea, near-syncope, numbness, orthopnea, palpitations, PND, sputum production, syncope, vomiting or weakness. The pain is aggravated by nothing. She has tried nothing for the symptoms. Risk factors include being elderly, post-menopausal, sedentary lifestyle and lack of exercise.  Her past medical history is significant for hypertension.  Pertinent negatives for past medical history include no aneurysm, no anxiety/panic attacks, no aortic aneurysm, no aortic dissection, no arrhythmia, no bicuspid aortic valve, no cancer, no congenital heart disease, no connective tissue disease, no COPD, no CHF, no diabetes, no hyperhomocysteinemia, no hyperlipidemia, no Kawasaki disease, no Marfan's syndrome, no MI, no mitral valve prolapse, no pacemaker, no PE, no PVD, no recent injury, no rheumatic fever, no seizures, no sickle cell disease, no sleep apnea, no spontaneous pneumothorax, no stimulant use, no strokes, no thyroid problem, no TIA, Turner syndrome and no valve disorder.  Pertinent negatives for family medical history include: family history of aortic dissection, no CAD in family, no connective tissue disease in family, no diabetes in family, no heart disease in family, no hyperlipidemia in family, no  hypertension in family, no Marfan's syndrome in family, no early MI in family, no PE in family, no PVD in family, no sickle cell disease in family, no stroke in family, no sudden death in family and no TIA in family. Prior diagnostic workup includes exercise treadmill test.  Shortness of Breath The current episode started in the past 7 days. The problem occurs constantly. The problem has been unchanged. Associated symptoms include chest pain. Pertinent negatives include no abdominal pain, claudication, coryza, ear pain, fever, headaches, hemoptysis, leg pain, leg swelling, neck pain, orthopnea, PND, rash, rhinorrhea, sore throat, sputum production, swollen glands, syncope or vomiting. The symptoms are aggravated by exercise. Risk factors include no known risk factors. She has tried nothing for the symptoms. There is no history of allergies, aspirin allergies, asthma, bronchiolitis, chronic lung disease, COPD, PE, pneumonia or a recent surgery.      Review of Systems  Constitutional: Negative for fever, malaise/fatigue and diaphoresis.  HENT: Negative for ear pain, sore throat, rhinorrhea and neck pain.   Respiratory: Positive for shortness of breath. Negative for cough, hemoptysis and sputum production.   Cardiovascular: Positive for chest pain. Negative for palpitations, orthopnea, claudication, leg swelling, syncope, PND and near-syncope.  Gastrointestinal: Negative for nausea, vomiting and abdominal pain.  Genitourinary: Negative.   Musculoskeletal: Negative for back pain.  Skin: Negative for rash.  Neurological: Negative for dizziness, seizures, weakness, numbness and headaches.       Objective:   Physical Exam  Nursing note and vitals reviewed. Constitutional: She is oriented to person, place, and time. She appears well-developed and well-nourished.  HENT:  Head: Normocephalic and atraumatic.  Eyes: Conjunctivae are normal. Pupils are equal, round, and reactive to light. Scleral  icterus is present.  Neck: Normal range of motion. Neck supple.  No JVD present.  Cardiovascular: Normal rate, regular rhythm and normal heart sounds.   Pulmonary/Chest: Effort normal and breath sounds normal.  Abdominal: Soft.  Musculoskeletal: Normal range of motion.  Neurological: She is alert and oriented to person, place, and time.  Skin: Skin is warm and dry.          Assessment & Plan:  Chest pain, shortness of breath and fatigue.   Had negative stress test 6 weeks ago ER, IV, O2 EKG:  LBBB ASA 324 Nitro

## 2011-11-20 NOTE — ED Notes (Signed)
Patient with chest pain that started one week ago.  Patient states she did have some shortness of breath with the pain.  Patient states that it is a pressure.  At its worse it was a 7/10.  Now a 4/10, patient was given 325mg  ASA at [facility].  EMS gave one sl nitro enroute.

## 2011-11-20 NOTE — ED Notes (Signed)
Patient with chest pain for last week.  Patient went to [facility] today for another reason and was found to have chest pain.  Patient is CAOx3, painfree at this time.  Patient did have some shortness of breath with the pain.

## 2011-11-20 NOTE — ED Provider Notes (Signed)
History     CSN: [id number]  Arrival date & time 11/20/11  1925   First MD Initiated Contact with Patient 11/20/11 1953      Chief Complaint  Patient presents with  . Chest Pain    HPI The patient presents to the emergency room with complaints of chest pain and chest pressure. History is somewhat limited in that the patient has a poor memory. She does recall that she's been having pain in her chest for about the last week. It is a pressure type discomfort associated with shortness of breath. It will come and go but she has felt like it has been more constant the last day or 2.  She has noted some burping as well recently. Patient does not have history of heart disease. She did see a cardiologist with the [facility] recently and had a negative stress test.  Patient does not remember having trouble with chest pain before these last few days however her son states that she had mentioned it before and that is what prompted to do the stress test procedure. Patient was at her foot doctor is today and mentioned the chest discomfort. They gave her aspirin and nitroglycerin with resolution of her symptoms. She was sent to the emergency room for further evaluation. Past Medical History  Diagnosis Date  . Cancer     colon ca  . Thyroid disease   . HTN (hypertension)   . GERD (gastroesophageal reflux disease)   . Depression   . Bipolar 1 disorder     Past Surgical History  Procedure Date  . Colon surgery   . Abdominal hysterectomy   . Cholecystectomy     Family History  Problem Relation Age of Onset  . Depression Mother     History  Substance Use Topics  . Smoking status: Never Smoker   . Smokeless tobacco: Not on file  . Alcohol Use: No     very seldom    OB History    Grav Para Term Preterm Abortions TAB SAB Ect Mult Living                  Review of Systems  Musculoskeletal:       Patient does have chronic left arm pain problems from a prior injury but this is not  associated with her chest pain  All other systems reviewed and are negative.    Allergies  Review of patient's allergies indicates no known allergies.  Home Medications   Current Outpatient Rx  Name Route Sig Dispense Refill  . IMATINIB MESYLATE 100 MG PO TABS Oral Take 3 tablets (300 mg total) by mouth daily. Take with meals and large glass of water.Caution:Chemotherapy 90 tablet 1  . LEVOTHYROXINE SODIUM 137 MCG PO TABS Oral Take 117 mcg by mouth daily.     Marland Kitchen METOPROLOL TARTRATE 50 MG PO TABS Oral Take 50 mg by mouth daily.    Marland Kitchen OXCARBAZEPINE 300 MG PO TABS Oral Take 300-600 mg by mouth 2 (two) times daily. Take 1 tablet by mouth in the morning and 2 tablets by mouth at bedtime.    Marland Kitchen PAROXETINE HCL 20 MG PO TABS Oral Take 20 mg by mouth daily.      BP 116/64  Pulse 74  Temp 99.1 F (37.3 C) (Oral)  Resp 16  SpO2 98%  Physical Exam  Nursing note and vitals reviewed. Constitutional: She appears well-developed and well-nourished. No distress.  HENT:  Head: Normocephalic and atraumatic.  Right  Ear: External ear normal.  Left Ear: External ear normal.  Eyes: Conjunctivae are normal. Right eye exhibits no discharge. Left eye exhibits no discharge. No scleral icterus.  Neck: Neck supple. No tracheal deviation present.  Cardiovascular: Normal rate, regular rhythm and intact distal pulses.   Pulmonary/Chest: Effort normal and breath sounds normal. No stridor. No respiratory distress. She has no wheezes. She has no rales.  Abdominal: Soft. Bowel sounds are normal. She exhibits no distension. There is no tenderness. There is no rebound and no guarding.  Musculoskeletal: She exhibits no edema and no tenderness.  Neurological: She is alert. She has normal strength. No sensory deficit. Cranial nerve deficit:  no gross defecits noted. She exhibits normal muscle tone. She displays no seizure activity. Coordination normal.  Skin: Skin is warm and dry. No rash noted.  Psychiatric: She has a  normal mood and affect.    ED Course  Procedures (including critical care time)  Rate: 73  Rhythm: normal sinus rhythm  QRS Axis: normal  Intervals: normal  ST/T Wave abnormalities: normal  Conduction Disutrbances: First degree AV block and left bundle branch block  Narrative Interpretation: abnl  Old EKG Reviewed: Nonspecific ST-T wave changes when compared to prior EKG  Labs Reviewed  CBC - Abnormal; Notable for the following:    RBC 3.74 (*)     HCT 35.5 (*)     All other components within normal limits  COMPREHENSIVE METABOLIC PANEL - Abnormal; Notable for the following:    Glucose, Bld 100 (*)     GFR calc non Af Amer 81 (*)     All other components within normal limits  D-DIMER, QUANTITATIVE - Abnormal; Notable for the following:    D-Dimer, Quant 0.57 (*)     All other components within normal limits  PROTIME-INR  APTT  POCT I-STAT TROPONIN I  POCT I-STAT TROPONIN I   Dg Chest Portable 1 View  11/20/2011  *RADIOLOGY REPORT*  Clinical Data: Chest pain  PORTABLE CHEST - 1 VIEW  Comparison: 11/30/2010  Findings: Mild cardiomegaly.  Clear lungs.  Normal vascularity. Proximal left humerus deformity is stable.  No consolidation.  IMPRESSION: Cardiomegaly without edema.  Original Report Authenticated By: Donavan Burnet, M.D.   1. Chest pain   2. GERD (gastroesophageal reflux disease)    MDM  Pt has history of chest pain previously.  I discussed the case with Dr Mayford Knife and she was able to review the records from [facility].  Pt Had a negative stress test back in February.  Plan was to check two enzymes and if negative follow up in the office.   Pt had two sets of negative enzymes in the ED.  Symptoms may be related to GERD.  Discussed findings with patient and her son.       Celene Kras, MD 11/20/11 [phone]

## 2011-11-22 ENCOUNTER — Encounter: Payer: Self-pay | Admitting: Emergency Medicine

## 2011-12-02 ENCOUNTER — Emergency Department (HOSPITAL_COMMUNITY)
Admission: EM | Admit: 2011-12-02 | Discharge: 2011-12-02 | Disposition: A | Discharge status: Home / Self Care | Payer: Medicare Other | Attending: Emergency Medicine | Admitting: Emergency Medicine

## 2011-12-02 ENCOUNTER — Emergency Department (HOSPITAL_COMMUNITY): Payer: Medicare Other

## 2011-12-02 ENCOUNTER — Encounter (HOSPITAL_COMMUNITY): Payer: Self-pay

## 2011-12-02 DIAGNOSIS — Z79899 Other long term (current) drug therapy: Secondary | ICD-10-CM | POA: Insufficient documentation

## 2011-12-02 DIAGNOSIS — F319 Bipolar disorder, unspecified: Secondary | ICD-10-CM | POA: Insufficient documentation

## 2011-12-02 DIAGNOSIS — R0602 Shortness of breath: Secondary | ICD-10-CM | POA: Insufficient documentation

## 2011-12-02 DIAGNOSIS — R0989 Other specified symptoms and signs involving the circulatory and respiratory systems: Secondary | ICD-10-CM | POA: Insufficient documentation

## 2011-12-02 DIAGNOSIS — I10 Essential (primary) hypertension: Secondary | ICD-10-CM | POA: Insufficient documentation

## 2011-12-02 DIAGNOSIS — E079 Disorder of thyroid, unspecified: Secondary | ICD-10-CM | POA: Insufficient documentation

## 2011-12-02 DIAGNOSIS — K219 Gastro-esophageal reflux disease without esophagitis: Secondary | ICD-10-CM | POA: Insufficient documentation

## 2011-12-02 DIAGNOSIS — R0609 Other forms of dyspnea: Secondary | ICD-10-CM | POA: Insufficient documentation

## 2011-12-02 DIAGNOSIS — R0600 Dyspnea, unspecified: Secondary | ICD-10-CM

## 2011-12-02 LAB — POCT I-STAT, CHEM 8
BUN: 13 mg/dL (ref 6–23)
Calcium, Ion: 1.23 mmol/L (ref 1.13–1.30)
Chloride: 106 mEq/L (ref 96–112)
Creatinine, Ser: 0.8 mg/dL (ref 0.50–1.10)
Sodium: 139 mEq/L (ref 135–145)
TCO2: 23 mmol/L (ref 0–100)

## 2011-12-02 LAB — CBC WITH DIFFERENTIAL/PLATELET
Basophils Absolute: 0 10*3/uL (ref 0.0–0.1)
Basophils Relative: 1 % (ref 0–1)
Eosinophils Relative: 1 % (ref 0–5)
HCT: 37.1 % (ref 36.0–46.0)
MCHC: 34 g/dL (ref 30.0–36.0)
MCV: 95.9 fL (ref 78.0–100.0)
Monocytes Absolute: 0.6 10*3/uL (ref 0.1–1.0)
Monocytes Relative: 9 % (ref 3–12)
RDW: 13.8 % (ref 11.5–15.5)

## 2011-12-02 NOTE — ED Notes (Signed)
Pt woke this am not feeling right, nonpainful breathing, dx Monday with reflux, VSS, did not take BP meds

## 2011-12-02 NOTE — ED Notes (Signed)
Patient transported to X-ray 

## 2011-12-02 NOTE — ED Provider Notes (Signed)
History     CSN: [id number]  Arrival date & time 12/02/11  2536   First MD Initiated Contact with Patient 12/02/11 [phone]      Chief Complaint  Patient presents with  . Shortness of Breath    (Consider location/radiation/quality/duration/timing/severity/associated sxs/prior treatment) HPI This 78 year old female is a chronic chest pain syndrome 24 hours a day felt as pressure across her chest without radiation or associated symptoms for several months, that is unchanged it is not her problem today, she reports to the emergency department today at this morning for an unknown period of time she had feeling of mild to moderate shortness of breath. She is no fever or cough. She had no new chest pain with that episode. She does not know how long the episode lasted. It may have lasted several seconds were several minutes. It did not last several hours. She had no associated new symptoms. There is no change in her chest pain. She had no back pain abdominal pain cough runny nose sore throat focal weakness bloody stools lightheadedness or other concerns. There was no treatment prior to arrival. Her episode resolved completely and she feels baseline now. Past Medical History  Diagnosis Date  . Cancer     colon ca  . Thyroid disease   . HTN (hypertension)   . GERD (gastroesophageal reflux disease)   . Depression   . Bipolar 1 disorder    chronic chest pain syndrome dementia  Past Surgical History  Procedure Date  . Colon surgery   . Abdominal hysterectomy   . Cholecystectomy     Family History  Problem Relation Age of Onset  . Depression Mother     History  Substance Use Topics  . Smoking status: Former Games developer  . Smokeless tobacco: Not on file  . Alcohol Use: No     very seldom    OB History    Grav Para Term Preterm Abortions TAB SAB Ect Mult Living                  Review of Systems  Constitutional: Negative for fever.       10 Systems reviewed and are negative for acute  change except as noted in the HPI.  HENT: Negative for congestion.   Eyes: Negative for discharge and redness.  Respiratory: Positive for shortness of breath. Negative for cough.   Cardiovascular: Positive for chest pain. Negative for palpitations and leg swelling.  Gastrointestinal: Negative for vomiting and abdominal pain.  Musculoskeletal: Negative for back pain.  Skin: Negative for rash.  Neurological: Negative for syncope, numbness and headaches.  Psychiatric/Behavioral:       No behavior change.    Allergies  Review of patient's allergies indicates no known allergies.  Home Medications   Current Outpatient Rx  Name Route Sig Dispense Refill  . ACETAMINOPHEN 500 MG PO TABS Oral Take 500 mg by mouth every 6 (six) hours as needed. For pain    . CLONAZEPAM 0.5 MG PO TABS Oral Take 0.25 mg by mouth daily as needed. For anxiety/sleep    . IMATINIB MESYLATE 100 MG PO TABS Oral Take 3 tablets (300 mg total) by mouth daily. Take with meals and large glass of water.Caution:Chemotherapy 90 tablet 1  . LEVOTHYROXINE SODIUM 137 MCG PO TABS Oral Take 137 mcg by mouth daily.     Marland Kitchen METOPROLOL TARTRATE 50 MG PO TABS Oral Take 50 mg by mouth daily.    . ADULT MULTIVITAMIN W/MINERALS CH Oral Take 1  tablet by mouth daily.    Marland Kitchen OMEPRAZOLE 20 MG PO CPDR Oral Take 1 capsule (20 mg total) by mouth daily. 14 capsule 1  . OXCARBAZEPINE 300 MG PO TABS Oral Take 300-600 mg by mouth 2 (two) times daily. Take 1 tablet by mouth in the morning and 2 tablets by mouth at bedtime.    Marland Kitchen PAROXETINE HCL 20 MG PO TABS Oral Take 20 mg by mouth daily.    . TERBINAFINE HCL 250 MG PO TABS Oral Take 250 mg by mouth daily. For 90 days. Started June 24th, 2013.      BP 147/83  Pulse 66  Temp 98.1 F (36.7 C) (Oral)  Resp 18  SpO2 97%  Physical Exam  Nursing note and vitals reviewed. Constitutional:       Awake, alert, nontoxic appearance.  HENT:  Head: Atraumatic.  Eyes: Right eye exhibits no discharge. Left  eye exhibits no discharge.  Neck: Neck supple.  Cardiovascular: Normal rate and regular rhythm.   No murmur heard. Pulmonary/Chest: Effort normal and breath sounds normal. No respiratory distress. She has no wheezes. She has no rales. She exhibits tenderness.       Baseline chronic anterior mild chest wall tenderness without rash  Abdominal: Soft. There is no tenderness. There is no rebound.  Musculoskeletal: She exhibits no edema and no tenderness.       Baseline ROM, no obvious new focal weakness.  Neurological:       Mental status and motor strength appears baseline for patient and situation.  Skin: No rash noted.  Psychiatric: She has a normal mood and affect.    ED Course  Procedures (including critical care time) Discussed with patient's son, the patient has had these symptoms for several months, the son does not suspect acute coronary syndrome, the son does not want the patient to stay for repeat cardiac markers, I believe it is reasonable, I believe the patient hasn't age adjusted d-dimer that is actually negative, the son relates the patient has poor memory and dementia at baseline and that she has had multiple evaluations for about 9 months now since the death of her husband for her symptoms. Labs Reviewed  D-DIMER, QUANTITATIVE - Abnormal; Notable for the following:    D-Dimer, Quant 0.60 (*)     All other components within normal limits  POCT I-STAT, CHEM 8 - Abnormal; Notable for the following:    Glucose, Bld 102 (*)     All other components within normal limits  CBC WITH DIFFERENTIAL  POCT I-STAT TROPONIN I  LAB REPORT - SCANNED   Dg Chest 2 View  12/02/2011  *RADIOLOGY REPORT*  Clinical Data: Shortness of breath  CHEST - 2 VIEW  Comparison: 11/20/2011  Findings: Increased interstitial markings, chronic.  No frank interstitial edema. No pleural effusion or pneumothorax.  The heart is top normal size.  Degenerative changes of the visualized thoracolumbar spine.  Mild mid  thoracic compression deformities.  Old right rib fracture deformities.  IMPRESSION: No evidence of acute cardiopulmonary disease.  Chronic interstitial markings without frank interstitial edema.  Original Report Authenticated By: Charline Bills, M.D.     1. Dyspnea       MDM  I doubt any other EMC precluding discharge at this time including, but not necessarily limited to the following:ACS.        Hurman Horn, MD 12/03/11 2130

## 2011-12-07 ENCOUNTER — Other Ambulatory Visit: Payer: Self-pay | Admitting: *Deleted

## 2011-12-07 ENCOUNTER — Other Ambulatory Visit: Payer: Self-pay | Admitting: Lab

## 2011-12-07 DIAGNOSIS — C9210 Chronic myeloid leukemia, BCR/ABL-positive, not having achieved remission: Secondary | ICD-10-CM

## 2011-12-07 MED ORDER — IMATINIB MESYLATE 100 MG PO TABS: 300.0000 mg | ORAL_TABLET | Freq: Every day | ORAL | Status: DC

## 2011-12-07 NOTE — Telephone Encounter (Signed)
Made patient and son aware that Melea does not need to come in for her CBC today--were able to see results from ER visit on 12/02/11 and counts were normal. Follow up on 10/24 as scheduled. Son was appreciative of call. Will check BCR in October. Informed patient this is done only every 6 months.

## 2011-12-07 NOTE — Telephone Encounter (Signed)
THIS REFILL REQUEST FOR GLEEVEC WAS GIVEN TO DR.SHERRILL'S NURSE, SUSAN COWARD,RN. 

## 2011-12-10 ENCOUNTER — Ambulatory Visit (INDEPENDENT_AMBULATORY_CARE_PROVIDER_SITE_OTHER): Payer: Medicare Other | Admitting: Psychiatry

## 2011-12-10 ENCOUNTER — Encounter (HOSPITAL_COMMUNITY): Payer: Self-pay | Admitting: Psychiatry

## 2011-12-10 VITALS — BP 128/80 | HR 74

## 2011-12-10 DIAGNOSIS — F39 Unspecified mood [affective] disorder: Secondary | ICD-10-CM

## 2011-12-10 DIAGNOSIS — F319 Bipolar disorder, unspecified: Secondary | ICD-10-CM

## 2011-12-10 MED ORDER — OXCARBAZEPINE 300 MG PO TABS: ORAL_TABLET | ORAL | Status: DC

## 2011-12-10 MED ORDER — PAROXETINE HCL 30 MG PO TABS: 30.0000 mg | ORAL_TABLET | Freq: Every day | ORAL | Status: DC

## 2011-12-10 MED ORDER — CLONAZEPAM 0.5 MG PO TABS: 0.2500 mg | ORAL_TABLET | Freq: Every day | ORAL | Status: DC | PRN

## 2011-12-10 NOTE — Progress Notes (Signed)
Chief complaint More depressed and anxious.      History of present illness Patient is an 78 year old Caucasian recently widowed female who came for her appointment.  Patient was brought in by her granddaughter.  Patient admitted recently she is feeling more anxious depressed and isolated.  She's not sleeping very well.  She is concerned about her anxiety and nervousness.  She was seeing emergency department for chronic chest pain .  She's wondering if her medication can be increased.  She's compliant with her medication and reported no side effects.  She denies any agitation anger but remains labile in the conversation.  She does not want to see therapist at this time.  She admitted that sometime she does not want to do anything and stays by herself.  She continued to endorse episodes of anger and irritability however she denies any active or passive suicidal thoughts or homicidal thoughts.  She's not drinking or using any illegal substance.  Current psychiatric medication Paxil 20 mg daily Trileptal 300 mg 1 in the morning and 2 at bedtime Klonopin 0.5 mg half to one tablet as needed.  Her last prescription was given in February 2013.  She still has 2 pills left.  Past psychiatric history Patient has long history of psychiatric illness. She was diagnosed with postpartum depression 55 years ago. She has multiple psychiatric inpatient treatment. Most of her psychiatric admission was due to her agitation and mood swings. She has seen in the past but Dr. Sharyon Medicus, Leanor Rubenstein, Dr. Alyse Low. [facility] also [facility] In the past she had tried Wellbutrin Depakote with limited response. She does not want to talk about her past psychiatric history.  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  Family history Patient denies any family history of  psychiatric illness  Alcohol and substance use Patient denies any history of alcohol and substance use  Psychosocial history Patient was born and raised in [place]. She moved to [place] with her husband due to his job. The patient has one son who now more involved in her care. Her husband died due to melanoma.  Patient lives by herself.  Her son lives 5 minutes away.  Mental status examination Patient is casually dressed and fairly groomed. She uses wheelchair for ambulation. She is easily irritable and distracted. She maintained fair eye contact. She denies any active or passive suicidal thoughts or homicidal thoughts. She denies any auditory or visual hallucination. She described her mood is anxious tired and sad and her affect is constricted.  She is difficult to engage in conversation .  She's alert and oriented x3. She gets easily frustrated with questions. There are no tremors or shakes present. Her insight judgment and impulse control is fair.  Assessment Axis I mood disorder NOS, bipolar disorder, grief Axis II deferred Axis III see medical history Axis IV moderate  Plan I will increase her Paxil to 30 mg daily.  She will continue Trileptal and Klonopin at present does.  She will require a new prescription of Klonopin .  I recommend to call us if she feels worsening of the symptom or if she has any question about the medication.  I explained the risk and benefits of medication.  I will see her again in 6 weeks.  Time spent 30 minutes.  Portion of this note is generated with voice dictation software and may contain typographical error.

## 2011-12-25 ENCOUNTER — Telehealth: Payer: Self-pay | Admitting: *Deleted

## 2011-12-25 NOTE — Telephone Encounter (Signed)
Patient had called office on 8/12 and 8/13 asking about her lab results. Called her back and made her aware that she had no labs here recently. Her July CBC was in ER and it was normal. Molecular testing not due till October with office visit. Gave her date/time of October appt.

## 2012-01-04 ENCOUNTER — Telehealth: Payer: Self-pay | Admitting: *Deleted

## 2012-01-04 NOTE — Telephone Encounter (Signed)
Received call from [facility] requesting office to call pt regarding compliance to medicines. Returned call to pharmacy spoke with Jess Barters, asking what medicine.  She stated that pt had extra bottle of Gleevec and did know how she got this and was concerned about compliance of pt.  Stated just wanted MD to be aware of concern.    Called pt and spoke with her regarding how she is taking Gleevec.  She stated "I took it today when the pharmacy called me, but I don't remember the last time I took it before today" She confirms she is to take 3 tab daily with meals. Pt also wanted to know if she should come in early to see Dr. Truett Perna since she has forgotten about medicine.  Per Dr. Truett Perna, can see pt approximately in next 2 weeks.  POF completed.

## 2012-01-09 ENCOUNTER — Telehealth: Payer: Self-pay | Admitting: Oncology

## 2012-01-09 NOTE — Telephone Encounter (Signed)
S/w pt re appt for 9/3. Per 8/23 pof r/s 10/24 lb/fu for within the next 2wks w/BS or LT.

## 2012-01-15 ENCOUNTER — Telehealth: Payer: Self-pay | Admitting: *Deleted

## 2012-01-15 ENCOUNTER — Other Ambulatory Visit: Payer: Self-pay | Admitting: Lab

## 2012-01-15 ENCOUNTER — Ambulatory Visit: Payer: Self-pay | Admitting: Nurse Practitioner

## 2012-01-15 NOTE — Telephone Encounter (Signed)
Patient called and moved her appts from today to tomorrow. Patient not feeling well. JMW

## 2012-01-16 ENCOUNTER — Telehealth: Payer: Self-pay | Admitting: Oncology

## 2012-01-16 ENCOUNTER — Encounter: Payer: Self-pay | Admitting: Nurse Practitioner

## 2012-01-16 ENCOUNTER — Other Ambulatory Visit: Payer: Self-pay | Admitting: Lab

## 2012-01-16 ENCOUNTER — Telehealth: Payer: Self-pay | Admitting: *Deleted

## 2012-01-16 NOTE — Progress Notes (Signed)
No encounter. Disregard.

## 2012-01-16 NOTE — Telephone Encounter (Signed)
called pt back per 9/4 pof and changed appt to 9/6    aom

## 2012-01-16 NOTE — Telephone Encounter (Signed)
Received call from pt asking about appt schedule.  Returned call and spoke with pt, per schedule will come in for labs 1:45 Friday 01/18/12 then see Lonna Cobb, NP.  Pt verbalized understanding and was able to voice back date/time of appt.

## 2012-01-16 NOTE — Telephone Encounter (Signed)
someone called to ck on appt today,called and lm with appt info   aom

## 2012-01-16 NOTE — Telephone Encounter (Signed)
Patient's son called regarding her appts. I have given her the appts for Friday. JMW

## 2012-01-18 ENCOUNTER — Other Ambulatory Visit (HOSPITAL_BASED_OUTPATIENT_CLINIC_OR_DEPARTMENT_OTHER): Payer: Medicare Other | Admitting: Lab

## 2012-01-18 ENCOUNTER — Ambulatory Visit: Payer: Medicare Other | Admitting: Nurse Practitioner

## 2012-01-18 ENCOUNTER — Telehealth: Payer: Self-pay | Admitting: Oncology

## 2012-01-18 VITALS — BP 157/73 | HR 98 | Temp 98.7°F | Resp 20 | Ht 63.5 in | Wt 172.8 lb

## 2012-01-18 DIAGNOSIS — C9210 Chronic myeloid leukemia, BCR/ABL-positive, not having achieved remission: Secondary | ICD-10-CM

## 2012-01-18 LAB — CBC WITH DIFFERENTIAL/PLATELET
Eosinophils Absolute: 0.1 10*3/uL (ref 0.0–0.5)
HCT: 38.1 % (ref 34.8–46.6)
LYMPH%: 18.4 % (ref 14.0–49.7)
MCV: 97.2 fL (ref 79.5–101.0)
MONO#: 0.5 10*3/uL (ref 0.1–0.9)
MONO%: 8.6 % (ref 0.0–14.0)
NEUT#: 3.9 10*3/uL (ref 1.5–6.5)
NEUT%: 69.6 % (ref 38.4–76.8)
Platelets: 228 10*3/uL (ref 145–400)
RBC: 3.92 10*6/uL (ref 3.70–5.45)

## 2012-01-18 LAB — COMPREHENSIVE METABOLIC PANEL (CC13)
BUN: 25 mg/dL (ref 7.0–26.0)
CO2: 23 mEq/L (ref 22–29)
Calcium: 9.3 mg/dL (ref 8.4–10.4)
Chloride: 107 mEq/L (ref 98–107)
Creatinine: 0.7 mg/dL (ref 0.6–1.1)
Glucose: 126 mg/dl — ABNORMAL HIGH (ref 70–99)
Total Bilirubin: 0.4 mg/dL (ref 0.20–1.20)

## 2012-01-18 NOTE — Progress Notes (Signed)
OFFICE PROGRESS NOTE  Interval history:  [name] is an 78 year old woman with CML. She continues Gleevec 300 mg daily. She reports good compliance with Gleevec. She denies consistent nausea or diarrhea. No mouth sores. No skin rash.   Objective: Blood pressure 157/73, pulse 98, temperature 98.7 F (37.1 C), temperature source Oral, resp. rate 20, height 5' 3.5" (1.613 m), weight 172 lb 12.8 oz (78.382 kg).  Oropharynx is without thrush or ulceration. Lungs are clear. Regular cardiac rhythm. Abdomen soft and nontender. No organomegaly. Trace lower leg edema bilaterally.  Lab Results: Lab Results  Component Value Date   WBC 5.6 01/18/2012   HGB 12.7 01/18/2012   HCT 38.1 01/18/2012   MCV 97.2 01/18/2012   PLT 228 01/18/2012    Chemistry:    Chemistry      Component Value Date/Time   NA 139 12/02/2011 1104   K 3.7 12/02/2011 1104   CL 106 12/02/2011 1104   CO2 26 11/20/2011 2017   BUN 13 12/02/2011 1104   CREATININE 0.80 12/02/2011 1104      Component Value Date/Time   CALCIUM 9.2 11/20/2011 2017   ALKPHOS 54 11/20/2011 2017   AST 21 11/20/2011 2017   ALT 18 11/20/2011 2017   BILITOT 0.3 11/20/2011 2017       Studies/Results: No results found.  Medications: I have reviewed the patient's current medications.  Assessment/Plan:  1. Chronic phase chronic myelogenous leukemia. She continues Gleevec 300 mg daily.  2. History of stage III colon cancer, diagnosed in 2004.  3. Bipolar disease. 4. Dysuria, followed by Dr. Annabell Howells. 5. "Neuropathy," followed by Dr. Sandria Manly.  Disposition-[name] appears stable from a hematology standpoint. We will followup on the BCR-abl from today. She will return for a lab visit in 3 months and an office visit in 6 months. She will contact the office in the interim with any problems.  Plan reviewed with Dr. Truett Perna.  Lonna Cobb ANP/GNP-BC

## 2012-01-18 NOTE — Telephone Encounter (Signed)
Gave pt appt calendar for December 2013 lab only then March 2014 lab and MD

## 2012-01-21 ENCOUNTER — Ambulatory Visit (HOSPITAL_COMMUNITY): Payer: Self-pay | Admitting: Psychiatry

## 2012-01-24 LAB — BCR/ABL

## 2012-02-01 ENCOUNTER — Encounter (HOSPITAL_COMMUNITY): Payer: Self-pay | Admitting: *Deleted

## 2012-02-01 ENCOUNTER — Emergency Department (HOSPITAL_COMMUNITY): Payer: Medicare Other

## 2012-02-01 ENCOUNTER — Emergency Department (HOSPITAL_COMMUNITY)
Admission: EM | Admit: 2012-02-01 | Discharge: 2012-02-01 | Disposition: A | Discharge status: Home / Self Care | Payer: Medicare Other | Attending: Emergency Medicine | Admitting: Emergency Medicine

## 2012-02-01 DIAGNOSIS — W19XXXA Unspecified fall, initial encounter: Secondary | ICD-10-CM

## 2012-02-01 DIAGNOSIS — IMO0002 Reserved for concepts with insufficient information to code with codable children: Secondary | ICD-10-CM | POA: Insufficient documentation

## 2012-02-01 DIAGNOSIS — Z79899 Other long term (current) drug therapy: Secondary | ICD-10-CM | POA: Insufficient documentation

## 2012-02-01 DIAGNOSIS — R079 Chest pain, unspecified: Secondary | ICD-10-CM | POA: Insufficient documentation

## 2012-02-01 DIAGNOSIS — W100XXA Fall (on)(from) escalator, initial encounter: Secondary | ICD-10-CM | POA: Insufficient documentation

## 2012-02-01 DIAGNOSIS — T148XXA Other injury of unspecified body region, initial encounter: Secondary | ICD-10-CM

## 2012-02-01 DIAGNOSIS — R51 Headache: Secondary | ICD-10-CM | POA: Insufficient documentation

## 2012-02-01 DIAGNOSIS — M1120 Other chondrocalcinosis, unspecified site: Secondary | ICD-10-CM | POA: Insufficient documentation

## 2012-02-01 MED ORDER — TETANUS-DIPHTHERIA TOXOIDS TD 5-2 LFU IM INJ
0.5000 mL | INJECTION | Freq: Once | INTRAMUSCULAR | Status: AC
  Administered 2012-02-01: 0.5 mL via INTRAMUSCULAR
  Filled 2012-02-01: qty 0.5

## 2012-02-01 NOTE — ED Notes (Signed)
The pt is still in xray 

## 2012-02-01 NOTE — ED Notes (Signed)
The pt was able to remember her neighbors number .  Neighbor will attempt to contact someone to come to the hospital

## 2012-02-01 NOTE — ED Notes (Signed)
The pt just returned from xray.  alert

## 2012-02-01 NOTE — ED Notes (Signed)
EMS[place] tub[place]ing approx twice forward. Denies loc. Reports right shoulder pain. Skin laceration to bilateral knees. Skin tears to hands. Possible laceration to head? LSB and Ccollar in place.

## 2012-02-01 NOTE — ED Notes (Signed)
Wounds rebandaged.  The pt has talked to her son on the phone.  He will be coming over.  No more pain than initially

## 2012-02-01 NOTE — ED Notes (Signed)
Son to take pt home. wc to lobby pt alert and talkative. MAE randomly

## 2012-02-01 NOTE — ED Notes (Signed)
The pt was removed from the lsb c-collar remains. The pt is alert  Skin warm and dry.  She has abrasions to both knees cleaned with soap and water and dsd applied.  She also has abrasion to the dorsal rt hand and pain in the lt hand.  Sl swelling to the lt foor and the rt leg is shorter and externally rotated may be a position the pt is lying she has no pain.  Pulses present pedal bi-laterally. .  She cannot remember her neighbors name she needs to  call

## 2012-02-01 NOTE — ED Notes (Signed)
Neighbors number.  [phone]

## 2012-02-01 NOTE — ED Notes (Signed)
To x-ray

## 2012-02-01 NOTE — ED Provider Notes (Signed)
History     CSN: [id number]  Arrival date & time 02/01/12  1504   First MD Initiated Contact with Patient 02/01/12 1621      Chief Complaint  Patient presents with  . Fall  . Shoulder Pain    (Consider location/radiation/quality/duration/timing/severity/associated sxs/prior treatment) The history is provided by the patient.  [name] is a 78 y.o. female hx of HTN, GERD, depression, bipolar here with s/p fall. Was at the top of the escalator and her cane got caught and she tumbled down the escalator. No LOC but was on the floor afterwards. Has some abrasions to head and hand. No complaints of HA or neck pain.   Level V caveat- AMS  Past Medical History  Diagnosis Date  . Thyroid disease   . HTN (hypertension)   . GERD (gastroesophageal reflux disease)   . Depression   . Bipolar 1 disorder   . Cancer     colon ca    Past Surgical History  Procedure Date  . Colon surgery   . Abdominal hysterectomy   . Cholecystectomy     Family History  Problem Relation Age of Onset  . Depression Mother     History  Substance Use Topics  . Smoking status: Former Games developer  . Smokeless tobacco: Not on file  . Alcohol Use: No     very seldom    OB History    Grav Para Term Preterm Abortions TAB SAB Ect Mult Living                  Review of Systems  Skin: Positive for wound.  All other systems reviewed and are negative.    Allergies  Review of patient's allergies indicates no known allergies.  Home Medications   Current Outpatient Rx  Name Route Sig Dispense Refill  . ACETAMIN PO Oral Take 1 tablet by mouth daily as needed. Pain relief    . OCUVITE PO TABS Oral Take 1 tablet by mouth daily.    Marland Kitchen CLONAZEPAM 0.5 MG PO TABS Oral Take 0.25 mg by mouth daily as needed. For anxiety/sleep    . IMATINIB MESYLATE 100 MG PO TABS Oral Take 3 tablets (300 mg total) by mouth daily. Take with meals and large glass of water.Caution:Chemotherapy 90 tablet 2    Faxed to  [facility] [phone]  . LEVOTHYROXINE SODIUM 137 MCG PO TABS Oral Take 137 mcg by mouth daily.     Marland Kitchen METOPROLOL TARTRATE 50 MG PO TABS Oral Take 50 mg by mouth every morning.     . ADULT MULTIVITAMIN W/MINERALS CH Oral Take 1 tablet by mouth every morning.     Marland Kitchen OMEPRAZOLE 20 MG PO CPDR Oral Take 20 mg by mouth daily as needed. For heartburn    . OXCARBAZEPINE 300 MG PO TABS Oral Take 300-600 mg by mouth 2 (two) times daily. Take 1 tablet by mouth in the morning and 2 tablets by mouth at bedtime.    Marland Kitchen PAROXETINE HCL 30 MG PO TABS Oral Take 30 mg by mouth at bedtime.    Marland Kitchen VITAMIN E EX OIL Apply externally Apply 1 application topically daily. Apply to left big toe      BP 154/83  Pulse 80  Temp 98.6 F (37 C) (Oral)  Resp 20  SpO2 99%  Physical Exam  Nursing note and vitals reviewed. Constitutional: She is oriented to person, place, and time.       Well appearing, slightly demented  HENT:  Head: Normocephalic.  Mouth/Throat: Oropharynx is clear and moist.       Small abrasion on posterior scalp, no laceration  Eyes: Conjunctivae normal are normal. Pupils are equal, round, and reactive to light.  Neck: Neck supple.       No midline tenderness, C collar in place  Cardiovascular: Normal rate, regular rhythm and normal heart sounds.   Pulmonary/Chest: Effort normal and breath sounds normal.  Abdominal: Soft. Bowel sounds are normal.  Musculoskeletal: Normal range of motion.       Pelvis stable, nl ROM of bilateral hips  Neurological: She is alert and oriented to person, place, and time.  Skin: Skin is warm and dry.       Abrasions on hands and knees.   Psychiatric: She has a normal mood and affect. Her behavior is normal. Judgment and thought content normal.    ED Course  Procedures (including critical care time)  Labs Reviewed - No data to display Dg Chest 1 View  02/01/2012  *RADIOLOGY REPORT*  Clinical Data: Post fall  CHEST - 1 VIEW  Comparison:  12/02/2011  Findings: Cardiomediastinal silhouette is stable.  Mild thoracic dextroscoliosis again noted.  Stable multiple old right rib fractures.  Stable old fracture with nonunion of proximal left humerus.  No acute infiltrate or pulmonary edema.  Stable chronic mild interstitial prominence.  IMPRESSION: No active disease.  Stable chronic interstitial prominence.  Stable thoracic dextroscoliosis.  Stable old fractures.   Original Report Authenticated By: Natasha Mead, M.D.    Dg Pelvis 1-2 Views  02/01/2012  *RADIOLOGY REPORT*  Clinical Data: Fall down escalator  PELVIS - 1-2 VIEW  Comparison: Abdominal radiograph dated 12/19/2011  Findings: Bilateral total hip arthroplasties.  Lucency in the right proximal femur, raising the possibility of particle disease.  No fracture or dislocation is seen.  Degenerate changes of the lower lumbar spine.  IMPRESSION: No fracture or dislocation is seen.  Bilateral total hip arthroplasties.  Lucency in the right proximal femur, raising the possibility of particle disease.   Original Report Authenticated By: Charline Bills, M.D.    Ct Head Wo Contrast  02/01/2012  *RADIOLOGY REPORT*  Clinical Data:  Fall down escalator, headache  CT HEAD WITHOUT CONTRAST CT CERVICAL SPINE WITHOUT CONTRAST  Technique:  Multidetector CT imaging of the head and cervical spine was performed following the standard protocol without intravenous contrast.  Multiplanar CT image reconstructions of the cervical spine were also generated.  Comparison:  CT head dated 11/20/2010  CT HEAD  Findings: No evidence of parenchymal hemorrhage or extra-axial fluid collection. No mass lesion, mass effect, or midline shift.  No CT evidence of acute infarction.  Subcortical white matter and periventricular small vessel ischemic changes.  Intracranial atherosclerosis.  Global cortical atrophy.  No ventriculomegaly.  The visualized paranasal sinuses are essentially clear. The mastoid air cells are unopacified.  No  evidence of calvarial fracture.  IMPRESSION: No evidence of acute intracranial abnormality.  Atrophy with small vessel ischemic changes and intracranial atherosclerosis.  CT CERVICAL SPINE  Findings: Reversal of the normal cervical lordosis.  No evidence of fracture or dislocation.  To body heights are maintained.  The dens appears intact.  No prevertebral soft tissue swelling.  Moderate to severe multilevel degenerative changes.  Visualized thyroid is unremarkable.  Visualized lung apices are notable for mild pleural parenchymal scarring.  IMPRESSION: No evidence of acute traumatic injury to the cervical spine.  Moderate to severe multilevel degenerative changes.   Original Report Authenticated By:  Charline Bills, M.D.    Ct Cervical Spine Wo Contrast  02/01/2012  *RADIOLOGY REPORT*  Clinical Data:  Fall down escalator, headache  CT HEAD WITHOUT CONTRAST CT CERVICAL SPINE WITHOUT CONTRAST  Technique:  Multidetector CT imaging of the head and cervical spine was performed following the standard protocol without intravenous contrast.  Multiplanar CT image reconstructions of the cervical spine were also generated.  Comparison:  CT head dated 11/20/2010  CT HEAD  Findings: No evidence of parenchymal hemorrhage or extra-axial fluid collection. No mass lesion, mass effect, or midline shift.  No CT evidence of acute infarction.  Subcortical white matter and periventricular small vessel ischemic changes.  Intracranial atherosclerosis.  Global cortical atrophy.  No ventriculomegaly.  The visualized paranasal sinuses are essentially clear. The mastoid air cells are unopacified.  No evidence of calvarial fracture.  IMPRESSION: No evidence of acute intracranial abnormality.  Atrophy with small vessel ischemic changes and intracranial atherosclerosis.  CT CERVICAL SPINE  Findings: Reversal of the normal cervical lordosis.  No evidence of fracture or dislocation.  To body heights are maintained.  The dens appears intact.  No  prevertebral soft tissue swelling.  Moderate to severe multilevel degenerative changes.  Visualized thyroid is unremarkable.  Visualized lung apices are notable for mild pleural parenchymal scarring.  IMPRESSION: No evidence of acute traumatic injury to the cervical spine.  Moderate to severe multilevel degenerative changes.   Original Report Authenticated By: Charline Bills, M.D.    Dg Knee Complete 4 Views Left  02/01/2012  *RADIOLOGY REPORT*  Clinical Data: Fall down escalator, knee abrasion  LEFT KNEE - COMPLETE 4+ VIEW  Comparison: None.  Findings: No fracture or dislocation is seen.  Mild tricompartmental degenerative changes.  Visualized soft tissues are grossly unremarkable.  No suprapatellar knee joint effusion.  Vascular calcifications.  IMPRESSION: No fracture or dislocation is seen.   Original Report Authenticated By: Charline Bills, M.D.    Dg Knee Complete 4 Views Right  02/01/2012  *RADIOLOGY REPORT*  Clinical Data: Post fall  RIGHT KNEE - COMPLETE 4+ VIEW  Comparison: None.  Findings: Four views of the right knee submitted.  No acute fracture or subluxation.  There is diffuse osteopenia.  Narrowing of patellofemoral joint space.  Diffuse mild narrowing of the joint space.  Mild spurring of the medial femoral condyle. Atherosclerotic vascular calcifications are noted.  IMPRESSION: No acute fracture or subluxation.  There is diffuse osteopenia. Narrowing of patellofemoral joint space.  Diffuse mild narrowing of the joint space.  Mild spurring of the medial femoral condyle.   Original Report Authenticated By: Natasha Mead, M.D.    Dg Hand Complete Left  02/01/2012  *RADIOLOGY REPORT*  Clinical Data: Post fall  LEFT HAND - COMPLETE 3+ VIEW  Comparison: None.  Findings: Three views of the left hand submitted.  There is diffuse osteopenia.  Mild chondrocalcinosis radiocarpal joint. Degenerative changes first carpal metacarpal joint.  Mild degenerative changes distal interphalangeal joint second  and third finger.  IMPRESSION: No acute fracture or subluxation.  Chondrocalcinosis.  Degenerative changes as described above.   Original Report Authenticated By: Natasha Mead, M.D.    Dg Hand Complete Right  02/01/2012  *RADIOLOGY REPORT*  Clinical Data: Pain post fall  RIGHT HAND - COMPLETE 3+ VIEW  Comparison: None.  Findings: Three views of the right hand submitted.  No acute fracture or subluxation.  There is diffuse osteopenia. Chondrocalcinosis noted right wrist joint.  Degenerative changes are noted anterior articular surface of the navicular. Degenerative changes are  noted third metacarpal phalangeal joint with erosive changes and dystrophic hypertrophic  calcifications.  IMPRESSION: No acute fracture or subluxation.  Diffuse osteopenia. Degenerative changes as described above.   Original Report Authenticated By: Natasha Mead, M.D.      No diagnosis found.    MDM  PHANTASIA DAJANI is a 78 y.o. female hx of bilateral hip replacements, HTN here with s/p fall down escalator. Patient is kept on C collar, CT head/neck ordered. Xray ordered and tetanus given. Will reassess after imaging.   8:16 PM Patient's CT head/neck showed no bleed or fractures. Xray showed no fractures. Wounds cleaned by nurse. No lacerations to repair. C collar cleared. She is able to ambulate with assistance. Will d/c home with her son.         Richardean Canal, MD 02/01/12 2017

## 2012-02-01 NOTE — ED Notes (Signed)
[name], SON (ONLY CHILD), [phone]- COULD WE PLEASE CALL HIM ANY UPDATES.

## 2012-03-03 ENCOUNTER — Emergency Department (HOSPITAL_COMMUNITY): Payer: Medicare Other

## 2012-03-03 ENCOUNTER — Observation Stay (HOSPITAL_COMMUNITY)
Admission: EM | Admit: 2012-03-03 | Discharge: 2012-03-05 | Disposition: A | Discharge status: Home / Self Care | Payer: Medicare Other | Attending: Internal Medicine | Admitting: Internal Medicine

## 2012-03-03 ENCOUNTER — Encounter (HOSPITAL_COMMUNITY): Payer: Self-pay | Admitting: *Deleted

## 2012-03-03 DIAGNOSIS — I10 Essential (primary) hypertension: Secondary | ICD-10-CM | POA: Insufficient documentation

## 2012-03-03 DIAGNOSIS — I447 Left bundle-branch block, unspecified: Secondary | ICD-10-CM

## 2012-03-03 DIAGNOSIS — S066X0A Traumatic subarachnoid hemorrhage without loss of consciousness, initial encounter: Principal | ICD-10-CM | POA: Insufficient documentation

## 2012-03-03 DIAGNOSIS — F0280 Dementia in other diseases classified elsewhere without behavioral disturbance: Secondary | ICD-10-CM | POA: Insufficient documentation

## 2012-03-03 DIAGNOSIS — G309 Alzheimer's disease, unspecified: Secondary | ICD-10-CM | POA: Insufficient documentation

## 2012-03-03 DIAGNOSIS — I200 Unstable angina: Secondary | ICD-10-CM

## 2012-03-03 DIAGNOSIS — R269 Unspecified abnormalities of gait and mobility: Secondary | ICD-10-CM | POA: Insufficient documentation

## 2012-03-03 DIAGNOSIS — I609 Nontraumatic subarachnoid hemorrhage, unspecified: Secondary | ICD-10-CM

## 2012-03-03 DIAGNOSIS — F39 Unspecified mood [affective] disorder: Secondary | ICD-10-CM

## 2012-03-03 DIAGNOSIS — W19XXXA Unspecified fall, initial encounter: Secondary | ICD-10-CM

## 2012-03-03 HISTORY — DX: Chronic myeloid leukemia, BCR/ABL-positive, not having achieved remission: C92.10

## 2012-03-03 LAB — COMPREHENSIVE METABOLIC PANEL
Alkaline Phosphatase: 79 U/L (ref 39–117)
BUN: 25 mg/dL — ABNORMAL HIGH (ref 6–23)
Chloride: 103 mEq/L (ref 96–112)
GFR calc Af Amer: 89 mL/min — ABNORMAL LOW (ref >90)
GFR calc non Af Amer: 77 mL/min — ABNORMAL LOW (ref >90)
Glucose, Bld: 114 mg/dL — ABNORMAL HIGH (ref 70–99)
Potassium: 4.7 mEq/L (ref 3.5–5.1)
Total Bilirubin: 0.2 mg/dL — ABNORMAL LOW (ref 0.3–1.2)

## 2012-03-03 LAB — CBC WITH DIFFERENTIAL/PLATELET
Eosinophils Absolute: 0.1 10*3/uL (ref 0.0–0.7)
Hemoglobin: 12.6 g/dL (ref 12.0–15.0)
Lymphs Abs: 1.3 10*3/uL (ref 0.7–4.0)
MCH: 32.1 pg (ref 26.0–34.0)
Monocytes Relative: 6 % (ref 3–12)
Neutro Abs: 8.3 10*3/uL — ABNORMAL HIGH (ref 1.7–7.7)
Neutrophils Relative %: 80 % — ABNORMAL HIGH (ref 43–77)
RBC: 3.92 MIL/uL (ref 3.87–5.11)

## 2012-03-03 LAB — URINE MICROSCOPIC-ADD ON

## 2012-03-03 LAB — URINALYSIS, ROUTINE W REFLEX MICROSCOPIC
Hgb urine dipstick: NEGATIVE
Nitrite: NEGATIVE
Protein, ur: NEGATIVE mg/dL
Urobilinogen, UA: 0.2 mg/dL (ref 0.0–1.0)

## 2012-03-03 LAB — POCT I-STAT TROPONIN I: Troponin i, poc: 0.02 ng/mL (ref 0.00–0.08)

## 2012-03-03 MED ORDER — ACETAMINOPHEN 325 MG PO TABS: 650.0000 mg | ORAL_TABLET | Freq: Four times a day (QID) | ORAL | Status: DC | PRN

## 2012-03-03 MED ORDER — CLONAZEPAM 0.5 MG PO TABS: 0.2500 mg | ORAL_TABLET | Freq: Every day | ORAL | Status: DC | PRN

## 2012-03-03 MED ORDER — OXCARBAZEPINE 300 MG PO TABS
300.0000 mg | ORAL_TABLET | Freq: Two times a day (BID) | ORAL | Status: DC
  Administered 2012-03-03: 300 mg via ORAL
  Filled 2012-03-03 (×3): qty 2

## 2012-03-03 MED ORDER — SODIUM CHLORIDE 0.9 % IV SOLN: 250.0000 mL | INTRAVENOUS | Status: DC | PRN

## 2012-03-03 MED ORDER — SODIUM CHLORIDE 0.9 % IJ SOLN: 3.0000 mL | INTRAMUSCULAR | Status: DC | PRN

## 2012-03-03 MED ORDER — SODIUM CHLORIDE 0.9 % IJ SOLN: 3.0000 mL | Freq: Two times a day (BID) | INTRAMUSCULAR | Status: DC

## 2012-03-03 MED ORDER — TERBINAFINE HCL 250 MG PO TABS
250.0000 mg | ORAL_TABLET | Freq: Every day | ORAL | Status: DC
  Administered 2012-03-03 – 2012-03-05 (×3): 250 mg via ORAL
  Filled 2012-03-03 (×3): qty 1

## 2012-03-03 MED ORDER — ACETAMINOPHEN 650 MG RE SUPP: 650.0000 mg | Freq: Four times a day (QID) | RECTAL | Status: DC | PRN

## 2012-03-03 MED ORDER — METOPROLOL SUCCINATE ER 50 MG PO TB24
50.0000 mg | ORAL_TABLET | Freq: Every morning | ORAL | Status: DC
  Administered 2012-03-04 – 2012-03-05 (×2): 50 mg via ORAL
  Filled 2012-03-03 (×2): qty 1

## 2012-03-03 MED ORDER — DARIFENACIN HYDROBROMIDE ER 15 MG PO TB24
15.0000 mg | ORAL_TABLET | Freq: Every day | ORAL | Status: DC
  Administered 2012-03-03 – 2012-03-05 (×3): 15 mg via ORAL
  Filled 2012-03-03 (×3): qty 1

## 2012-03-03 MED ORDER — SODIUM CHLORIDE 0.9 % IJ SOLN
3.0000 mL | Freq: Two times a day (BID) | INTRAMUSCULAR | Status: DC
  Administered 2012-03-04 (×2): 3 mL via INTRAVENOUS

## 2012-03-03 MED ORDER — LEVOTHYROXINE SODIUM 137 MCG PO TABS: 137.0000 ug | ORAL_TABLET | Freq: Every day | ORAL | Status: DC

## 2012-03-03 MED ORDER — SODIUM CHLORIDE 0.9 % IV SOLN
INTRAVENOUS | Status: DC
  Administered 2012-03-03: 17:00:00 via INTRAVENOUS

## 2012-03-03 MED ORDER — LEVOTHYROXINE SODIUM 137 MCG PO TABS
137.0000 ug | ORAL_TABLET | Freq: Every day | ORAL | Status: DC
  Administered 2012-03-04 – 2012-03-05 (×2): 137 ug via ORAL
  Filled 2012-03-03 (×4): qty 1

## 2012-03-03 MED ORDER — IMATINIB MESYLATE 100 MG PO TABS: 300.0000 mg | ORAL_TABLET | Freq: Every day | ORAL | Status: DC

## 2012-03-03 MED ORDER — SODIUM CHLORIDE 0.9 % IV SOLN
INTRAVENOUS | Status: AC
  Administered 2012-03-03: 21:00:00 via INTRAVENOUS

## 2012-03-03 MED ORDER — NITROGLYCERIN 0.3 MG SL SUBL
0.3000 mg | SUBLINGUAL_TABLET | SUBLINGUAL | Status: DC | PRN
  Filled 2012-03-03: qty 100

## 2012-03-03 MED ORDER — NITROGLYCERIN 0.4 MG SL SUBL: 0.4000 mg | SUBLINGUAL_TABLET | SUBLINGUAL | Status: DC | PRN

## 2012-03-03 MED ORDER — PAROXETINE HCL 30 MG PO TABS
30.0000 mg | ORAL_TABLET | Freq: Every day | ORAL | Status: DC
  Administered 2012-03-03 – 2012-03-04 (×2): 30 mg via ORAL
  Filled 2012-03-03 (×3): qty 1

## 2012-03-03 NOTE — ED Notes (Signed)
Pt's care companion reports that pt must have tripped on her pants because pt was found on the floor in the BR, she reports that her leg was on the tub and her head was by the door.  Pt does not recall if she tripped on something or felt dizzy prior to fall.  Pt reports hitting the back of her head on the floor.  No hematoma palpated or noted at this time, but pt c/o pain on back of her head.

## 2012-03-03 NOTE — ED Notes (Signed)
Bed:WHALA<BR> Expected date:<BR> Expected time:<BR> Means of arrival:<BR> Comments:<BR> fall

## 2012-03-03 NOTE — ED Notes (Signed)
Per EMS, pt from home, reports fall this am, [facility] nurse reports pt was in the BR, assumes pt tripped over her pants and hit her head on floor.  Pt is alert and oriented.  Reports "bump" on back of her head.  Denies LOC at this time.

## 2012-03-03 NOTE — ED Notes (Signed)
Attempted to call report, nurse unavailable.

## 2012-03-03 NOTE — ED Notes (Signed)
Pt reporting a "drawing feeling in bilateral groin area."

## 2012-03-03 NOTE — Progress Notes (Signed)
Pharmacy - Brief Note  Gleevec was ordered from the home medication list. Per [facility] policy, oral chemotherapy should be held if the patient meets certain criteria for that medication. The criteria for Imatinib (Gleevec) is:  Hgb < 8  ANC < 1  Pltc < 100K  AST or ALT >5x ULN  Bilirubin > 3x ULN  Gastrointestinal perforation  Hemorrhage  New or worsened CHF  Severe fluid overload  Per the H&P, the patient fell PTA and CT of the head revealed subarachnoid hemorrhage therefore the order for Gleevec was rejected.  Please consult oncology to consider when to restart.   Charolotte Eke, PharmD, pager [phone]. 03/03/2012,9:16 PM.

## 2012-03-03 NOTE — H&P (Signed)
History and Physical  [name] UUV:[id number] DOB: 1928/04/17 DOA: 03/03/2012  Referring physician: Lorre Nick, MD PCP: [name], MD  Oncologist: Mancel Bale, MD  Chief Complaint: fall  HPI:  78 year old woman presented to ED after fall today. CT head revealed subarachnoid hemorrhage, presumed traumatic. Dr. Freida Busman has consulted Dr. Danielle Dess who will see patient.  Patient remembers getting up off commode, then clearly remembers falling, striking head on tile. She denies loss of consciousness and reports memory before, during and afterwards. She was unable to get up without assistance and so EMS was called. Patient has great difficulty providing history and feels compelled to revisit her traumatic fall down escalator.  Son (at bedside) was not present, reports he was told patient fell in bathroom.  Per nursing notes, aide at house (present during days) assumed patient tripped on pants and fell.  Per son: This is the 6th fall over the last several months, including a fall down an escalator at a [employer] store in September. Patient is non-compliant with her walker and often walks or transfers without assistance. Patient has assistance with ADLs and day-time care. Son and others check on patient daily.   In ED was noted to be afebrile with stable vitals. CBC, CMP were unremarkable, urinalysis was equivocal. CT cervical spine unremarkable.  Review of Systems:   Negative for fever, visual changes, sore throat,hest pain, SOB, dysuria, bleeding, n/v/abdominal pain.  Positive for rash under left breast, bilateral thigh pain  Past Medical History  Diagnosis Date  . Thyroid disease   . HTN (hypertension)   . GERD (gastroesophageal reflux disease)   . Depression   . Bipolar 1 disorder   . Cancer     colon ca  . CML (chronic myelocytic leukemia)     Past Surgical History  Procedure Date  . Colon surgery   . Abdominal hysterectomy   . Cholecystectomy   . Total hip  arthroplasty     bilateral    Social History:  reports that she has quit smoking. She does not have any smokeless tobacco history on file. She reports that she does not drink alcohol or use illicit drugs.  No Known Allergies  Family History  Problem Relation Age of Onset  . Depression Mother     Prior to Admission medications   Medication Sig Start Date End Date Taking? Authorizing Provider  acetaminophen (TYLENOL) 500 MG tablet Take 500 mg by mouth every 6 (six) hours as needed. pain   Yes Historical Provider, MD  beta carotene w/minerals (OCUVITE) tablet Take 1 tablet by mouth daily.   Yes Historical Provider, MD  clonazePAM (KLONOPIN) 0.5 MG tablet Take 0.25 mg by mouth daily as needed. For anxiety/sleep 12/10/11  Yes Cleotis Nipper, MD  darifenacin (ENABLEX) 7.5 MG 24 hr tablet Take 15 mg by mouth daily.   Yes Historical Provider, MD  imatinib (GLEEVEC) 100 MG tablet Take 300 mg by mouth daily. Take with meals and large glass of water.Caution:Chemotherapy   Yes Historical Provider, MD  levothyroxine (SYNTHROID, LEVOTHROID) 137 MCG tablet Take 137 mcg by mouth daily.    Yes Reather Littler, MD  metoprolol succinate (TOPROL-XL) 50 MG 24 hr tablet Take 50 mg by mouth every morning.   Yes Historical Provider, MD  Multiple Vitamin (MULTIVITAMIN WITH MINERALS) TABS Take 1 tablet by mouth every morning.    Yes Historical Provider, MD  nitroGLYCERIN (NITROSTAT) 0.3 MG SL tablet Place 0.3 mg under the tongue every 5 (five) minutes as needed. Chest  pain   Yes Historical Provider, MD  omeprazole (PRILOSEC) 20 MG capsule Take 20 mg by mouth daily as needed. For heartburn   Yes Historical Provider, MD  Oxcarbazepine (TRILEPTAL) 300 MG tablet Take 300-600 mg by mouth 2 (two) times daily. Take 1 tablet by mouth in the morning and 2 tablets by mouth at bedtime. 12/10/11  Yes Cleotis Nipper, MD  PARoxetine (PAXIL) 30 MG tablet Take 30 mg by mouth at bedtime.   Yes Historical Provider, MD  terbinafine (LAMISIL)  250 MG tablet Take 250 mg by mouth daily.   Yes Historical Provider, MD  VITAMIN E, TOPICAL, OIL Apply 1 application topically daily. Apply to left big toe   Yes Historical Provider, MD   Physical Exam: Filed Vitals:   03/03/12 1314 03/03/12 1610 03/03/12 1612 03/03/12 1615  BP: 136/84 133/84 153/78 141/90  Pulse: 76 87 87 79  Temp: 99.1 F (37.3 C)     TempSrc: Oral     Resp: 16     SpO2: 98%      General:  Examined in ED. Appears calm and comfortable. Eyes: PERRL, normal lids, irises; wears glasses ENT: grossly normal hearing, lips & tongue Neck: no LAD, masses or thyromegaly Cardiovascular: RRR, no m/r/g. No LE edema. Respiratory: CTA bilaterally, no w/r/r. Normal respiratory effort. Abdomen: soft, ntnd Skin: healing abrasions left knee, several excoriated areas bilateral lower legs. No bruising legs, back, abdomen Musculoskeletal: grossly normal tone BUE/BLE, normal strength BUE/BLE. No pain with palpation of back cervical to lumbar, bilateral upper and lower extremities Psychiatric: grossly normal mood and affect, speech fluent and appropriate Neurologic: CN II-XII intact  Labs on Admission:  Basic Metabolic Panel:  Lab 03/03/12 8657  NA 140  K 4.7  CL 103  CO2 28  GLUCOSE 114*  BUN 25*  CREATININE 0.74  CALCIUM 10.3  MG --  PHOS --    Liver Function Tests:  Lab 03/03/12 1610  AST 25  ALT 14  ALKPHOS 79  BILITOT 0.2*  PROT 6.9  ALBUMIN 3.6   CBC:  Lab 03/03/12 1610  WBC 10.4  NEUTROABS 8.3*  HGB 12.6  HCT 37.1  MCV 94.6  PLT 273    Basename 03/03/12 1620  TROPIPOC 0.02    Radiological Exams on Admission: Ct Head Wo Contrast  03/03/2012  *RADIOLOGY REPORT*  Clinical Data: Tripped and found on floor.  Not sure if felt dizzy prior to fall.  CT HEAD WITHOUT CONTRAST  Technique:  Contiguous axial images were obtained from the base of the skull through the vertex without contrast.  Comparison: 02/01/2012  Findings: Subarachnoid blood in the high  convexity bilaterally. Per discussion with Dr. Freida Busman, this probably is related to the fall as the patient did not have symptoms of a primary subarachnoid hemorrhage.  Additionally, the distribution of subarachnoid blood is separate from the typical location for typical aneurysm bleeds.  No skull fracture or subdural hematoma.  Global atrophy without hydrocephalus.  Prominent small vessel disease type changes without CT evidence of acute thrombotic infarct.  Vascular calcifications.  IMPRESSION: Subarachnoid blood in the high convexity bilaterally.  Please see above discussion.  Critical Value/emergent results were called by telephone at the time of interpretation on 10/21/ 2013 at 3:50 p.m. to Dr. Freida Busman, who verbally acknowledged these results.   Original Report Authenticated By: Fuller Canada, M.D.    Ct Cervical Spine Wo Contrast  03/03/2012  *RADIOLOGY REPORT*  Clinical Data: Fall.  Neck pain.  History of Alzheimer's  disease.  CT CERVICAL SPINE WITHOUT CONTRAST  Technique:  Multidetector CT imaging of the cervical spine was performed. Multiplanar CT image reconstructions were also generated.  Comparison: 02/01/2012 cervical spine CT.  Findings: There is a levoconvex torticollis.  Odontoid intact. Multilevel degenerative disease is present.  Degenerative anterolisthesis of CT C3, C3 on C4 and retrolisthesis of C5 on C6 measuring about 2 mm at each level.  Multilevel facet arthrosis is present.  There is no cervical spine fracture.  Multilevel foraminal stenosis associated with uncovertebral spurring and facet arthrosis.  Incidental imaging the lung apices is within normal limits.  Compared to prior, no interval change aside from the curvature which is probably positional.  Concave contour of the T2 superior endplate is similar to prior with prominent Schmorl's node.  IMPRESSION: Multilevel cervical spondylosis appears similar to prior.  No acute osseous abnormality.   Original Report Authenticated By:  Andreas Newport, M.D.     EKG: Independently reviewed. Poor quality. Presumed ST, PVC, NSIVCD, probably LBBB, no acute changes. LBBB seen on study 11/20/2011.   Principal Problem:  *Subarachnoid hemorrhage Active Problems:  Fall   Assessment/Plan 1. Fall--history most suggestive of mechanical fall. No history or evidence to suggest acute CNS process or cardiac event. History is not suggestive of syncope. PT evaluation. Fall precautions. 2. Subarachnoid hemorrhage--discussed with Dr. Danielle Dess (neurosurgery) at bedside. He recommends monitoring overnight, neurologic checks, but no repeat imaging unless clinical condition changes. He does not feel that patient requires transfer to [facility]. Discontinue aspirin. No anticoagulants. 3. Hypothyroidism--continue Synthroid.  4. History of CML--in remission. Continue Gleevec. Has follow-up appointment with Dr. Truett Perna soon. Will notify of admission via Epic messaging tonight. 5. Bipolar--appears stable per son. Continue Paxil, Trileptal   Code Status: DNR (per patient and son/HCPOA confirms) Family Communication: discussed with son/HCPOA [name] at bedside Disposition Plan/Anticipated LOS: home 10/22  Time spent: 65 minutes  Brendia Sacks, MD  [facility] Team 6 Pager [phone]. If 8PM-8AM, please contact night-coverage at [email or web address], password [facility] 03/03/2012, 5:35 PM

## 2012-03-03 NOTE — ED Provider Notes (Signed)
History     CSN: [id number]  Arrival date & time 03/03/12  1308   First MD Initiated Contact with Patient 03/03/12 1511      Chief Complaint  Patient presents with  . Fall    (Consider location/radiation/quality/duration/timing/severity/associated sxs/prior treatment) Patient is a 78 y.o. female presenting with fall. The history is provided by the patient.  Fall   patient here after suffering an unwitnessed fall at home and bathroom. She had unknown loss of consciousness. Patient denies pain to the occipital area. Denies any neck pain. She does have a history of mild Alzheimer's and therefore the story is somewhat confusing. There was a caregiver at home states she has been normal. No bleeding was appreciated. No recent illnesses. He denies any chest or abdominal pain. Denies any prodromal symptoms prior to the event today.  Past Medical History  Diagnosis Date  . Thyroid disease   . HTN (hypertension)   . GERD (gastroesophageal reflux disease)   . Depression   . Bipolar 1 disorder   . Cancer     colon ca    Past Surgical History  Procedure Date  . Colon surgery   . Abdominal hysterectomy   . Cholecystectomy     Family History  Problem Relation Age of Onset  . Depression Mother     History  Substance Use Topics  . Smoking status: Former Games developer  . Smokeless tobacco: Not on file  . Alcohol Use: No     very seldom    OB History    Grav Para Term Preterm Abortions TAB SAB Ect Mult Living                  Review of Systems  All other systems reviewed and are negative.    Allergies  Review of patient's allergies indicates no known allergies.  Home Medications   Current Outpatient Rx  Name Route Sig Dispense Refill  . ACETAMINOPHEN 500 MG PO TABS Oral Take 500 mg by mouth every 6 (six) hours as needed. pain    . OCUVITE PO TABS Oral Take 1 tablet by mouth daily.    Marland Kitchen CLONAZEPAM 0.5 MG PO TABS Oral Take 0.25 mg by mouth daily as needed. For  anxiety/sleep    . DARIFENACIN HYDROBROMIDE ER 7.5 MG PO TB24 Oral Take 15 mg by mouth daily.    . IMATINIB MESYLATE 100 MG PO TABS Oral Take 300 mg by mouth daily. Take with meals and large glass of water.Caution:Chemotherapy    . LEVOTHYROXINE SODIUM 137 MCG PO TABS Oral Take 137 mcg by mouth daily.     Marland Kitchen METOPROLOL SUCCINATE ER 50 MG PO TB24 Oral Take 50 mg by mouth every morning.    . ADULT MULTIVITAMIN W/MINERALS CH Oral Take 1 tablet by mouth every morning.     Marland Kitchen NITROGLYCERIN 0.3 MG SL SUBL Sublingual Place 0.3 mg under the tongue every 5 (five) minutes as needed. Chest pain    . OMEPRAZOLE 20 MG PO CPDR Oral Take 20 mg by mouth daily as needed. For heartburn    . OXCARBAZEPINE 300 MG PO TABS Oral Take 300-600 mg by mouth 2 (two) times daily. Take 1 tablet by mouth in the morning and 2 tablets by mouth at bedtime.    Marland Kitchen PAROXETINE HCL 30 MG PO TABS Oral Take 30 mg by mouth at bedtime.    . TERBINAFINE HCL 250 MG PO TABS Oral Take 250 mg by mouth daily.    Marland Kitchen VITAMIN  E EX OIL Apply externally Apply 1 application topically daily. Apply to left big toe      BP 136/84  Pulse 76  Temp 99.1 F (37.3 C) (Oral)  Resp 16  SpO2 98%  Physical Exam  Nursing note and vitals reviewed. Constitutional: She is oriented to person, place, and time. She appears well-developed and well-nourished.  Non-toxic appearance. No distress.  HENT:  Head: Normocephalic and atraumatic. Head is without abrasion and without contusion.  Eyes: Conjunctivae normal, EOM and lids are normal. Pupils are equal, round, and reactive to light.  Neck: Normal range of motion. Neck supple. Muscular tenderness present. No spinous process tenderness present. No tracheal deviation present. No mass present.    Cardiovascular: Normal rate, regular rhythm and normal heart sounds.  Exam reveals no gallop.   No murmur heard. Pulmonary/Chest: Effort normal and breath sounds normal. No stridor. No respiratory distress. She has no  decreased breath sounds. She has no wheezes. She has no rhonchi. She has no rales.  Abdominal: Soft. Normal appearance and bowel sounds are normal. She exhibits no distension. There is no tenderness. There is no rebound and no CVA tenderness.  Musculoskeletal: Normal range of motion. She exhibits no edema and no tenderness.  Neurological: She is alert and oriented to person, place, and time. She has normal strength. No cranial nerve deficit or sensory deficit. GCS eye subscore is 4. GCS verbal subscore is 5. GCS motor subscore is 6.  Skin: Skin is warm and dry. No abrasion and no rash noted.  Psychiatric: She has a normal mood and affect. Her speech is normal and behavior is normal.    ED Course  Procedures (including critical care time)   Labs Reviewed  URINALYSIS, ROUTINE W REFLEX MICROSCOPIC  URINE CULTURE  CBC WITH DIFFERENTIAL  COMPREHENSIVE METABOLIC PANEL   No results found.   No diagnosis found.    MDM  Head CT results noted. Spoke with Dr. Danielle Dess, the neurosurgeon on call. He will come and see the patient requested the patient be admitted to the [facility].   Date: 02/23/2012  Rate: 89  Rhythm: normal sinus rhythm  QRS Axis: normal  Intervals: normal  ST/T Wave abnormalities: nonspecific ST changes  Conduction Disutrbances:first-degree A-V block   Narrative Interpretation:   Old EKG Reviewed: unchanged        Toy Baker, MD 03/03/12 [phone]

## 2012-03-03 NOTE — Progress Notes (Signed)
CM noted cm consult Spoke with pt who confirms she has some memory issues States she has an aide from [employer] that stays with her Reports aide was with her in hospital today Reports her son takes care of most of her medical concerns and gave permission for staff to contact son as needed

## 2012-03-04 DIAGNOSIS — F39 Unspecified mood [affective] disorder: Secondary | ICD-10-CM

## 2012-03-04 DIAGNOSIS — I447 Left bundle-branch block, unspecified: Secondary | ICD-10-CM

## 2012-03-04 LAB — URINE CULTURE

## 2012-03-04 MED ORDER — PAROXETINE HCL 30 MG PO TABS: 30.0000 mg | ORAL_TABLET | Freq: Every day | ORAL | Status: DC

## 2012-03-04 MED ORDER — NITROGLYCERIN 0.3 MG SL SUBL: 0.3000 mg | SUBLINGUAL_TABLET | SUBLINGUAL | Status: AC | PRN

## 2012-03-04 MED ORDER — LEVOTHYROXINE SODIUM 137 MCG PO TABS: 137.0000 ug | ORAL_TABLET | Freq: Every day | ORAL | Status: AC

## 2012-03-04 MED ORDER — CLONAZEPAM 0.5 MG PO TABS: 0.2500 mg | ORAL_TABLET | Freq: Every day | ORAL | Status: DC | PRN

## 2012-03-04 MED ORDER — OXCARBAZEPINE 300 MG PO TABS
300.0000 mg | ORAL_TABLET | Freq: Every day | ORAL | Status: DC
  Administered 2012-03-04 – 2012-03-05 (×2): 300 mg via ORAL
  Filled 2012-03-04 (×2): qty 1

## 2012-03-04 MED ORDER — DARIFENACIN HYDROBROMIDE ER 7.5 MG PO TB24: 15.0000 mg | ORAL_TABLET | Freq: Every day | ORAL | Status: DC

## 2012-03-04 MED ORDER — OXCARBAZEPINE 300 MG PO TABS: 300.0000 mg | ORAL_TABLET | Freq: Two times a day (BID) | ORAL | Status: DC

## 2012-03-04 MED ORDER — IMATINIB MESYLATE 100 MG PO TABS: 300.0000 mg | ORAL_TABLET | Freq: Every day | ORAL | Status: DC

## 2012-03-04 MED ORDER — OXCARBAZEPINE 300 MG PO TABS
600.0000 mg | ORAL_TABLET | Freq: Every day | ORAL | Status: DC
  Administered 2012-03-04: 600 mg via ORAL
  Filled 2012-03-04 (×2): qty 2

## 2012-03-04 MED ORDER — ACETAMINOPHEN 500 MG PO TABS: 500.0000 mg | ORAL_TABLET | Freq: Four times a day (QID) | ORAL | Status: AC | PRN

## 2012-03-04 MED ORDER — TERBINAFINE HCL 250 MG PO TABS: 250.0000 mg | ORAL_TABLET | Freq: Every day | ORAL | Status: DC

## 2012-03-04 MED ORDER — OMEPRAZOLE 20 MG PO CPDR: 20.0000 mg | DELAYED_RELEASE_CAPSULE | Freq: Every day | ORAL | Status: AC | PRN

## 2012-03-04 MED ORDER — METOPROLOL SUCCINATE ER 50 MG PO TB24: 50.0000 mg | ORAL_TABLET | Freq: Every morning | ORAL | Status: AC

## 2012-03-04 NOTE — Evaluation (Signed)
Physical Therapy Evaluation Patient Details Name: [name] MRN: [id number] DOB: 25-Apr-1928 Today's Date: 03/04/2012 Time: 3086-5784 PT Time Calculation (min): 25 min  PT Assessment / Plan / Recommendation Clinical Impression  Pt presents with subarachnoid hemorrage after sustaining a fall at home in bathroom.  Noted per chart that pt has had several falls recently.  She does have a caregiver a few hours a day, however 24/7 supervision/assist not provided at this time.  Pt will benefit from skilled PT in acute venue to address deficits.  PT recommends SNF for follow up at D/C to increase pt safety.     PT Assessment  Patient needs continued PT services    Follow Up Recommendations  Post acute inpatient;Supervision/Assistance - 24 hour    Does the patient have the potential to tolerate intense rehabilitation   No, Recommend SNF  Barriers to Discharge Decreased caregiver support      Equipment Recommendations  None recommended by PT    Recommendations for Other Services OT consult   Frequency Min 3X/week    Precautions / Restrictions Precautions Precautions: Fall Restrictions Weight Bearing Restrictions: No   Pertinent Vitals/Pain No pain      Mobility  Bed Mobility Bed Mobility: Supine to Sit;Sitting - Scoot to Edge of Bed Supine to Sit: 4: Min assist Sitting - Scoot to Edge of Bed: 5: Supervision Details for Bed Mobility Assistance: Some assist for trunk when sitting on EOB.  cues for technique, hand placement and safety.  Transfers Transfers: Sit to Stand;Stand to Sit Sit to Stand: 1: +2 Total assist;From elevated surface;With upper extremity assist;From bed;From toilet Sit to Stand: Patient Percentage: 60% Stand to Sit: 1: +2 Total assist;With upper extremity assist;With armrests;To toilet;To chair/3-in-1 Stand to Sit: Patient Percentage: 60% Details for Transfer Assistance: Assist to rise and steady with cues for hand placement, safety and controlled descent  when sitting.  Ambulation/Gait Ambulation/Gait Assistance: 1: +2 Total assist Ambulation/Gait: Patient Percentage: 60% Ambulation Distance (Feet): 20 Feet (then another 20') Assistive device: Rolling walker Ambulation/Gait Assistance Details: Assist to steady throughout with max cues for sequencing/technique and positioning inside of RW.  Pt very unsteady and unsafe with ambulation and also demos impulsivity with all mobiltiy.  Gait Pattern: Step-through pattern;Decreased stride length;Trunk flexed;Right circumduction Gait velocity: Noted that R leg would "drag" somewhat, esp when turning with RW  in order to sit on recliner.  Stairs: No Wheelchair Mobility Wheelchair Mobility: No    Shoulder Instructions     Exercises     PT Diagnosis: Difficulty walking;Generalized weakness;Acute pain  PT Problem List: Decreased strength;Decreased activity tolerance;Decreased balance;Decreased mobility;Decreased coordination;Decreased cognition;Decreased knowledge of use of DME;Decreased safety awareness;Decreased knowledge of precautions;Pain PT Treatment Interventions: DME instruction;Gait training;Functional mobility training;Therapeutic activities;Therapeutic exercise;Balance training;Patient/family education   PT Goals Acute Rehab PT Goals PT Goal Formulation: With patient Time For Goal Achievement: 03/18/12 Potential to Achieve Goals: Fair Pt will go Supine/Side to Sit: with supervision PT Goal: Supine/Side to Sit - Progress: Goal set today Pt will go Sit to Supine/Side: with supervision PT Goal: Sit to Supine/Side - Progress: Goal set today Pt will go Sit to Stand: with supervision PT Goal: Sit to Stand - Progress: Goal set today Pt will go Stand to Sit: with supervision PT Goal: Stand to Sit - Progress: Goal set today Pt will Ambulate: 51 - 150 feet;with supervision;with least restrictive assistive device PT Goal: Ambulate - Progress: Goal set today  Visit Information  Last PT Received  On: 03/04/12 Assistance Needed: +1  Subjective Data  Subjective: I need to use the restroom.  Patient Stated Goal: n/a   Prior Functioning  Home Living Lives With: Alone Available Help at Discharge: Personal care attendant;Available PRN/intermittently Type of Home: House Home Adaptive Equipment: Walker - rolling Additional Comments: Pt states that an aide comes in and helps for some hours during the day.  Prior Function Level of Independence: Needs assistance Able to Take Stairs?: No Driving: No Vocation: Retired Musician: Clinical cytogeneticist  Overall Cognitive Status: Impaired Area of Impairment: Engineer, drilling;Awareness of errors;Awareness of deficits Arousal/Alertness: Awake/alert Orientation Level: Appears intact for tasks assessed Behavior During Session: [facility] for tasks performed Safety/Judgement: Decreased awareness of safety precautions;Decreased safety judgement for tasks assessed;Impulsive Awareness of Errors: Assistance required to identify errors made    Extremity/Trunk Assessment Right Lower Extremity Assessment RLE ROM/Strength/Tone: Deficits RLE ROM/Strength/Tone Deficits: Generalized weakness, grossly 3/5 per functional assessment.  RLE Sensation: WFL - Light Touch Left Lower Extremity Assessment LLE ROM/Strength/Tone: Deficits LLE ROM/Strength/Tone Deficits: Generalized weakness, grossly 3/5 per functional assessment.  LLE Sensation: WFL - Light Touch Trunk Assessment Trunk Assessment: Kyphotic   Balance    End of Session PT - End of Session Equipment Utilized During Treatment: Gait belt Activity Tolerance: Patient limited by fatigue Patient left: in chair;with call bell/phone within reach;with chair alarm set Nurse Communication: Mobility status  GP Functional Assessment Tool Used: Clinical Judgement Functional Limitation: Mobility: Walking and moving around Mobility: Walking and Moving Around Current Status (W0981): At least 40  percent but less than 60 percent impaired, limited or restricted Mobility: Walking and Moving Around Goal Status [phone]): At least 20 percent but less than 40 percent impaired, limited or restricted   Page, Meribeth Mattes 03/04/2012, 1:24 PM

## 2012-03-04 NOTE — Discharge Summary (Signed)
Physician Discharge Summary  DICKIE LABARRE ZOX:[id number] DOB: 1927-07-12 DOA: 03/03/2012  PCP: [name], MD  Admit date: 03/03/2012 Discharge date: 03/04/2012  Recommendations for Outpatient Follow-up:  1. Pt will need to follow up with PCP in 2-3 weeks post discharge 2. Please obtain BMP to evaluate electrolytes and kidney function 3. Please also check CBC to evaluate Hg and Hct levels 4. Please note that pt was advised to stop taking aspirin temporarily until seen by PCP who will then decide if aspirin can be continued 5. Please also note that PT evaluation was done and the recommendation was to proceed with SNF placement 6. Due to insurance requirements, earliest possible discharge is 03/06/2012  Discharge Diagnoses: Subarachnoid hemorrhage  Principal Problem:  *Subarachnoid hemorrhage Active Problems:  Fall  Discharge Condition: Stable  Diet recommendation: Heart healthy diet discussed in details   History of present illness:  78 year old woman presented to ED after fall at home. CT head revealed subarachnoid hemorrhage, presumed traumatic. Patient remembers getting up off commode, then clearly remembers falling, striking head on tile. She denies loss of consciousness and reports memory before, during and afterwards. She was unable to get up without assistance and so EMS was called. Patient has had great difficulty providing history. Per nursing notes, aid at house (present during days) assumed patient tripped on pants and fell. Per son: This is the 6th fall over the last several months, including a fall down an escalator at a department store in September. Patient is non-compliant with her walker and often walks or transfers without assistance. Patient has assistance with ADLs and day-time care. Son and others check on patient daily.   Hospital Course:  Principal Problem:  *Subarachnoid hemorrhage - pt seen by Dr. Danielle Dess - recommendation was to continue to  provide supportive care but no head CT repeat is necessary as this is small hemorrhage and surgical intervention is indicated - follow up in 4 weeks recommended  Active Problems:  Fall - this is likely secondary to progressive deconditioning and failure to thrive - placement to SNF to proceed - PT provided while in the hospital   Procedures/Studies: Ct Head Wo Contrast  03/03/2012  *RADIOLOGY REPORT*  Clinical Data: Tripped and found on floor.  Not sure if felt dizzy prior to fall.  CT HEAD WITHOUT CONTRAST  Technique:  Contiguous axial images were obtained from the base of the skull through the vertex without contrast.  Comparison: 02/01/2012  Findings: Subarachnoid blood in the high convexity bilaterally. Per discussion with Dr. Freida Busman, this probably is related to the fall as the patient did not have symptoms of a primary subarachnoid hemorrhage.  Additionally, the distribution of subarachnoid blood is separate from the typical location for typical aneurysm bleeds.  No skull fracture or subdural hematoma.  Global atrophy without hydrocephalus.  Prominent small vessel disease type changes without CT evidence of acute thrombotic infarct.  Vascular calcifications.  IMPRESSION: Subarachnoid blood in the high convexity bilaterally.  Please see above discussion.  Critical Value/emergent results were called by telephone at the time of interpretation on 10/21/ 2013 at 3:50 p.m. to Dr. Freida Busman, who verbally acknowledged these results.   Original Report Authenticated By: Fuller Canada, M.D.    Ct Cervical Spine Wo Contrast  03/03/2012  *RADIOLOGY REPORT*  Clinical Data: Fall.  Neck pain.  History of Alzheimer's disease.  CT CERVICAL SPINE WITHOUT CONTRAST  Technique:  Multidetector CT imaging of the cervical spine was performed. Multiplanar CT image reconstructions were also generated.  Comparison: 02/01/2012 cervical spine CT.  Findings: There is a levoconvex torticollis.  Odontoid intact. Multilevel  degenerative disease is present.  Degenerative anterolisthesis of CT C3, C3 on C4 and retrolisthesis of C5 on C6 measuring about 2 mm at each level.  Multilevel facet arthrosis is present.  There is no cervical spine fracture.  Multilevel foraminal stenosis associated with uncovertebral spurring and facet arthrosis.  Incidental imaging the lung apices is within normal limits.  Compared to prior, no interval change aside from the curvature which is probably positional.  Concave contour of the T2 superior endplate is similar to prior with prominent Schmorl's node.  IMPRESSION: Multilevel cervical spondylosis appears similar to prior.  No acute osseous abnormality.   Original Report Authenticated By: Andreas Newport, M.D.     Consultations:  Neurosurgery - Dr. Danielle Dess   Antibiotics:  None  Discharge Exam: Filed Vitals:   03/04/12 1436  BP: 112/52  Pulse: 64  Temp: 98.4 F (36.9 C)  Resp: 12   Filed Vitals:   03/03/12 2130 03/04/12 0444 03/04/12 1000 03/04/12 1436  BP: 137/89 127/57 131/87 112/52  Pulse: 82 73 70 64  Temp: 98.3 F (36.8 C) 98.5 F (36.9 C) 98.3 F (36.8 C) 98.4 F (36.9 C)  TempSrc: Oral Oral Oral Oral  Resp: 16 18 10 12   Height:      Weight:      SpO2: 98% 94%  98%    General: Pt is alert, follows commands appropriately, not in acute distress Cardiovascular: Regular rate and rhythm, S1/S2 +, no murmurs, no rubs, no gallops Respiratory: Clear to auscultation bilaterally, no wheezing, no crackles, no rhonchi Abdominal: Soft, non tender, non distended, bowel sounds +, no guarding Extremities: no edema, no cyanosis, pulses palpable bilaterally DP and PT Neuro: Grossly nonfocal  Discharge Instructions  Discharge Orders    Future Appointments: Provider: Department: Dept Phone: Center:   04/18/2012 3:30 PM Dava Najjar Idelle Jo [facility] [phone] None   07/17/2012 3:15 PM Dava Najjar Idelle Jo [facility] [phone] None   07/17/2012 3:45 PM Ladene Artist,  MD [facility] [phone] None       Medication List     As of 03/04/2012  6:32 PM    TAKE these medications         acetaminophen 500 MG tablet   Commonly known as: TYLENOL   Take 1 tablet (500 mg total) by mouth every 6 (six) hours as needed. pain      beta carotene w/minerals tablet   Take 1 tablet by mouth daily.      clonazePAM 0.5 MG tablet   Commonly known as: KLONOPIN   Take 0.5 tablets (0.25 mg total) by mouth daily as needed. For anxiety/sleep      darifenacin 7.5 MG 24 hr tablet   Commonly known as: ENABLEX   Take 2 tablets (15 mg total) by mouth daily.      imatinib 100 MG tablet   Commonly known as: GLEEVEC   Take 3 tablets (300 mg total) by mouth daily. Take with meals and large glass of water.Caution:Chemotherapy      levothyroxine 137 MCG tablet   Commonly known as: SYNTHROID, LEVOTHROID   Take 1 tablet (137 mcg total) by mouth daily.      metoprolol succinate 50 MG 24 hr tablet   Commonly known as: TOPROL-XL   Take 1 tablet (50 mg total) by mouth every morning.      multivitamin with minerals Tabs   Take 1 tablet  by mouth every morning.      nitroGLYCERIN 0.3 MG SL tablet   Commonly known as: NITROSTAT   Place 1 tablet (0.3 mg total) under the tongue every 5 (five) minutes as needed. Chest pain      omeprazole 20 MG capsule   Commonly known as: PRILOSEC   Take 1 capsule (20 mg total) by mouth daily as needed. For heartburn      Oxcarbazepine 300 MG tablet   Commonly known as: TRILEPTAL   Take 1-2 tablets (300-600 mg total) by mouth 2 (two) times daily. Take 1 tablet by mouth in the morning and 2 tablets by mouth at bedtime.      PARoxetine 30 MG tablet   Commonly known as: PAXIL   Take 1 tablet (30 mg total) by mouth at bedtime.      terbinafine 250 MG tablet   Commonly known as: LAMISIL   Take 1 tablet (250 mg total) by mouth daily.      VITAMIN E (TOPICAL) Oil   Apply 1 application topically daily. Apply to left big toe            Follow-up Information    Follow up with [name], MD. In 2 weeks.   Contact information:   [facility] [address] [place] [address] [phone]       Follow up with Stefani Dama, MD. In 2 weeks.   Contact information:   [address] [place] [address] [phone]           The results of significant diagnostics from this hospitalization (including imaging, microbiology, ancillary and laboratory) are listed below for reference.     Microbiology: No results found for this or any previous visit (from the past 240 hour(s)).   Labs: Basic Metabolic Panel:  Lab 03/03/12 4010  NA 140  K 4.7  CL 103  CO2 28  GLUCOSE 114*  BUN 25*  CREATININE 0.74  CALCIUM 10.3  MG --  PHOS --   Liver Function Tests:  Lab 03/03/12 1610  AST 25  ALT 14  ALKPHOS 79  BILITOT 0.2*  PROT 6.9  ALBUMIN 3.6   No results found for this basename: LIPASE:5,AMYLASE:5 in the last 168 hours No results found for this basename: AMMONIA:5 in the last 168 hours CBC:  Lab 03/03/12 1610  WBC 10.4  NEUTROABS 8.3*  HGB 12.6  HCT 37.1  MCV 94.6  PLT 273   Cardiac Enzymes: No results found for this basename: CKTOTAL:5,CKMB:5,CKMBINDEX:5,TROPONINI:5 in the last 168 hours BNP: BNP (last 3 results) No results found for this basename: PROBNP:3 in the last 8760 hours CBG: No results found for this basename: GLUCAP:5 in the last 168 hours   SIGNED: Time coordinating discharge: Over 30 minutes  Debbora Presto, MD  [facility] 03/04/2012, 6:32 PM Pager [phone]  If 7PM-7AM, please contact night-coverage [email or web address] Password [email or web address]

## 2012-03-04 NOTE — Consult Note (Signed)
Reason for Consult: Traumatic subarachnoid hemorrhage Referring Physician: Dr. Gershon Wyatt is an 78 y.o. female.  HPI: Patient is an 78 year old right-handed female who sustained a fall during a presumed syncopal episode. She apparently fell in the bathroom. A CT scan of the brain was performed that demonstrated the presence of subarachnoid blood in both superior convexities. There is no evidence of blood in the region of the basilar cisterns. There is a moderate amount of atrophy of the brain.  The patient denies any constitutional symptoms at current time denies any headaches other than her usual headaches. Her son is in attendance with her during the interview as was Dr. Irene Wyatt.  Past Medical History  Diagnosis Date  . Thyroid disease   . HTN (hypertension)   . GERD (gastroesophageal reflux disease)   . Depression   . Bipolar 1 disorder   . Cancer     colon ca  . CML (chronic myelocytic leukemia)     Past Surgical History  Procedure Date  . Colon surgery   . Abdominal hysterectomy   . Cholecystectomy   . Total hip arthroplasty     bilateral    Family History  Problem Relation Age of Onset  . Depression Mother     Social History:  reports that she quit smoking about 53 years ago. She has never used smokeless tobacco. She reports that she does not drink alcohol or use illicit drugs.  Allergies:  Allergies  Allergen Reactions  . Stelazine (Trifluoperazine)     "I wanted to climb the walls."    Medications: Not reviewed  Results for orders placed during the hospital encounter of 03/03/12 (from the past 48 hour(s))  URINALYSIS, ROUTINE W REFLEX MICROSCOPIC     Status: Abnormal   Collection Time   03/03/12  4:10 PM      Component Value Range Comment   Color, Urine YELLOW  YELLOW    APPearance CLEAR  CLEAR    Specific Gravity, Urine 1.021  1.005 - 1.030    pH 6.0  5.0 - 8.0    Glucose, UA NEGATIVE  NEGATIVE mg/dL    Hgb urine dipstick NEGATIVE   NEGATIVE    Bilirubin Urine NEGATIVE  NEGATIVE    Ketones, ur NEGATIVE  NEGATIVE mg/dL    Protein, ur NEGATIVE  NEGATIVE mg/dL    Urobilinogen, UA 0.2  0.0 - 1.0 mg/dL    Nitrite NEGATIVE  NEGATIVE    Leukocytes, UA SMALL (*) NEGATIVE   CBC WITH DIFFERENTIAL     Status: Abnormal   Collection Time   03/03/12  4:10 PM      Component Value Range Comment   WBC 10.4  4.0 - 10.5 K/uL    RBC 3.92  3.87 - 5.11 MIL/uL    Hemoglobin 12.6  12.0 - 15.0 g/dL    HCT 96.0  45.4 - 09.8 %    MCV 94.6  78.0 - 100.0 fL    MCH 32.1  26.0 - 34.0 pg    MCHC 34.0  30.0 - 36.0 g/dL    RDW 11.9  14.7 - 82.9 %    Platelets 273  150 - 400 K/uL    Neutrophils Relative 80 (*) 43 - 77 %    Neutro Abs 8.3 (*) 1.7 - 7.7 K/uL    Lymphocytes Relative 12  12 - 46 %    Lymphs Abs 1.3  0.7 - 4.0 K/uL    Monocytes Relative 6  3 - 12 %  Monocytes Absolute 0.6  0.1 - 1.0 K/uL    Eosinophils Relative 1  0 - 5 %    Eosinophils Absolute 0.1  0.0 - 0.7 K/uL    Basophils Relative 1  0 - 1 %    Basophils Absolute 0.1  0.0 - 0.1 K/uL   COMPREHENSIVE METABOLIC PANEL     Status: Abnormal   Collection Time   03/03/12  4:10 PM      Component Value Range Comment   Sodium 140  135 - 145 mEq/L    Potassium 4.7  3.5 - 5.1 mEq/L    Chloride 103  96 - 112 mEq/L    CO2 28  19 - 32 mEq/L    Glucose, Bld 114 (*) 70 - 99 mg/dL    BUN 25 (*) 6 - 23 mg/dL    Creatinine, Ser 1.91  0.50 - 1.10 mg/dL    Calcium 47.8  8.4 - 10.5 mg/dL    Total Protein 6.9  6.0 - 8.3 g/dL    Albumin 3.6  3.5 - 5.2 g/dL    AST 25  0 - 37 U/L    ALT 14  0 - 35 U/L    Alkaline Phosphatase 79  39 - 117 U/L    Total Bilirubin 0.2 (*) 0.3 - 1.2 mg/dL    GFR calc non Af Amer 77 (*) >90 mL/min    GFR calc Af Amer 89 (*) >90 mL/min   URINE MICROSCOPIC-ADD ON     Status: Abnormal   Collection Time   03/03/12  4:10 PM      Component Value Range Comment   Squamous Epithelial / LPF MANY (*) RARE    WBC, UA 3-6  <3 WBC/hpf    Bacteria, UA FEW (*) RARE     Urine-Other MUCOUS PRESENT     POCT I-STAT TROPONIN I     Status: Normal   Collection Time   03/03/12  4:20 PM      Component Value Range Comment   Troponin i, poc 0.02  0.00 - 0.08 ng/mL    Comment 3              Ct Head Wo Contrast  03/03/2012  *RADIOLOGY REPORT*  Clinical Data: Tripped and found on floor.  Not sure if felt dizzy prior to fall.  CT HEAD WITHOUT CONTRAST  Technique:  Contiguous axial images were obtained from the base of the skull through the vertex without contrast.  Comparison: 02/01/2012  Findings: Subarachnoid blood in the high convexity bilaterally. Per discussion with Dr. Freida Busman, this probably is related to the fall as the patient did not have symptoms of a primary subarachnoid hemorrhage.  Additionally, the distribution of subarachnoid blood is separate from the typical location for typical aneurysm bleeds.  No skull fracture or subdural hematoma.  Global atrophy without hydrocephalus.  Prominent small vessel disease type changes without CT evidence of acute thrombotic infarct.  Vascular calcifications.  IMPRESSION: Subarachnoid blood in the high convexity bilaterally.  Please see above discussion.  Critical Value/emergent results were called by telephone at the time of interpretation on 10/21/ 2013 at 3:50 p.m. to Dr. Freida Busman, who verbally acknowledged these results.   Original Report Authenticated By: Fuller Canada, M.D.    Ct Cervical Spine Wo Contrast  03/03/2012  *RADIOLOGY REPORT*  Clinical Data: Fall.  Neck pain.  History of Alzheimer's disease.  CT CERVICAL SPINE WITHOUT CONTRAST  Technique:  Multidetector CT imaging of the cervical spine was performed.  Multiplanar CT image reconstructions were also generated.  Comparison: 02/01/2012 cervical spine CT.  Findings: There is a levoconvex torticollis.  Odontoid intact. Multilevel degenerative disease is present.  Degenerative anterolisthesis of CT C3, C3 on C4 and retrolisthesis of C5 on C6 measuring about 2 mm at each  level.  Multilevel facet arthrosis is present.  There is no cervical spine fracture.  Multilevel foraminal stenosis associated with uncovertebral spurring and facet arthrosis.  Incidental imaging the lung apices is within normal limits.  Compared to prior, no interval change aside from the curvature which is probably positional.  Concave contour of the T2 superior endplate is similar to prior with prominent Schmorl's node.  IMPRESSION: Multilevel cervical spondylosis appears similar to prior.  No acute osseous abnormality.   Original Report Authenticated By: Andreas Newport, M.D.     Review of Systems  Constitutional: Positive for malaise/fatigue.  HENT: Negative.   Eyes: Negative.   Respiratory: Negative.   Cardiovascular: Negative.   Gastrointestinal: Negative.   Musculoskeletal: Negative.   Neurological: Positive for weakness.  Endo/Heme/Allergies: Negative.   Psychiatric/Behavioral: Negative.    Blood pressure 127/57, pulse 73, temperature 98.5 F (36.9 C), temperature source Oral, resp. rate 18, height 5\' 4"  (1.626 m), weight 80.8 kg (178 lb 2.1 oz), SpO2 94.00%. Physical Exam  Constitutional: She appears well-developed and well-nourished.  HENT:       Right parietal scalp tenderness without hematoma  Eyes: Conjunctivae normal are normal. Pupils are equal, round, and reactive to light.  Neck: Normal range of motion. Neck supple.  Respiratory: Effort normal.  Neurological: She is alert.       Mild disorientation slightlyhard of hearing follows commands briskly with all 4 extremities  Skin: Skin is warm and dry.  Psychiatric: She has a normal mood and affect. Her behavior is normal.    Assessment/Plan: Small traumatic subarachnoid hemorrhage along both convexities.  Patient can be observed for clinical status does not need a repeat CT scan unless her neurologic status changes or she deteriorates in any clinical fashion. Please reconsult [place] if necessary.  [name]  J 03/04/2012, 7:53 AM

## 2012-03-05 NOTE — Progress Notes (Signed)
Pt's son "Azucena Kuba" present and would like MD to call him. States he was here for admission process last pm, but did not speak to a doctor today, he feels "out of the loop" of what's going with his mother. Nurse Hydrographic surveyor) went discussed PT eval with him and he agrees that his mother probably needs rehab due to falls. Pt unable to tell him exactly what Dr. Izola Price discussed with her today, states "I just can't keep up with all the details".

## 2012-03-05 NOTE — Care Management Note (Signed)
    Page 1 of 1   03/05/2012     12:26:07 PM   CARE MANAGEMENT NOTE 03/05/2012  Patient:  [name], [name]   Account Number:  [id number]  Date Initiated:  03/05/2012  Documentation initiated by:  Lanier Clam  Subjective/Objective Assessment:   ADMITTED SUBARACHNOID HEMORRHAGE     Action/Plan:   FROM HOME   Anticipated DC Date:  03/05/2012   Anticipated DC Plan:  HOME W HOME HEALTH SERVICES      DC Planning Services  CM consult      Choice offered to / List presented to:  C-1 Patient        HH arranged  HH-1 RN  HH-2 PT  HH-3 OT      [facility] agency  [facility]   Status of service:  Completed, signed off Medicare Important Message given?   (If response is "NO", the following Medicare IM given date fields will be blank) Date Medicare IM given:   Date Additional Medicare IM given:    Discharge Disposition:    Per UR Regulation:  Reviewed for med. necessity/level of care/duration of stay  If discussed at Long Length of Stay Meetings, dates discussed:    Comments:  03/05/12 Andrea Sumida RN,BSN NCM [phone] CHOSEN,SOKE TO DEBBIE(LIASON) WHO IS AWARE OF D/C TODAY,& HH ORDERS.PATIENT ALREADY HAS COMFORT CARE SERVICES IN PLACE FOR ASSIST W/ADL'S,MEAL PREP,COMPANION.SPOKE TO SON WHO IS IN AGREEMENT TO PLAN.

## 2012-03-05 NOTE — Discharge Summary (Signed)
[name] YNW:[id number] DOB: 13-Sep-1927 DOA: 03/03/2012  PCP: [name], MD  Admit date: 03/03/2012  Discharge date: 03/05/2012   Recommendations for Outpatient Follow-up:  1. Pt will need to follow up with PCP in 2 week post discharge 2. Please obtain BMP to evaluate electrolytes and kidney function; also recheck TSH and order B12 and Vit D level; as part of outpatient work up for her falls. 3. Please also check CBC to evaluate Hg and Hct levels 4. Please note that pt was advised to stop taking aspirin temporarily until seen by PCP who will then decide if aspirin can be continued  Discharge Diagnoses: Subarachnoid hemorrhage  Principal Problem:  *Subarachnoid hemorrhage  Fall   Discharge Condition: Stable  Diet recommendation: Heart healthy diet discussed in details  History of present illness:  78 year old woman presented to ED after fall at home. CT head revealed subarachnoid hemorrhage, presumed traumatic. Patient remembers getting up off commode, then clearly remembers falling, striking head on tile. She denies loss of consciousness and reports memory before, during and afterwards.   Per nursing notes, aid at house (present during days) assumed patient tripped on pants and fell. Per son: This is the 6th fall over the last several months, including a fall down an escalator at a department store in September. Patient is non-compliant with her walker and often walks or transfers without assistance. Patient has assistance with ADLs and day-time care. Son and others check on patient daily.   Hospital Course:   *Subarachnoid hemorrhage  - pt seen by Dr. Danielle Dess  - recommendation was to continue to provide supportive care but no head CT repeat is necessary as this is small hemorrhage and surgical intervention is indicated  - follow up in 4 weeks recommended   Multiple falling episodes  - likely secondary to progressive deconditioning - HHPT, HHRN and HHOT has been  arranged for the patient; she also had private aid services, and the son will look to either extend those services to make sure she has assistance throughout the day or explore ALF placement. -Due to patient status, she doesn't qualify for [facility] placement and they are not looking out of pocket payments.  Hypothyroidism  -Continue synthroid.  *The rest of her medical problems remains stable and she will follow with her PCP in 2 weeks for further medication adjustments and evaluation of her chronic problems.  Procedures/Studies:  Ct Head Wo Contrast 03/03/2012 *RADIOLOGY REPORT* Clinical Data: Tripped and found on floor. Not sure if felt dizzy prior to fall. CT HEAD WITHOUT CONTRAST Technique: Contiguous axial images were obtained from the base of the skull through the vertex without contrast. Comparison: 02/01/2012 Findings: Subarachnoid blood in the high convexity bilaterally. Per discussion with Dr. Freida Busman, this probably is related to the fall as the patient did not have symptoms of a primary subarachnoid hemorrhage. Additionally, the distribution of subarachnoid blood is separate from the typical location for typical aneurysm bleeds. No skull fracture or subdural hematoma. Global atrophy without hydrocephalus. Prominent small vessel disease type changes without CT evidence of acute thrombotic infarct. Vascular calcifications. IMPRESSION: Subarachnoid blood in the high convexity bilaterally. Please see above discussion. Critical Value/emergent results were called by telephone at the time of interpretation on 10/21/ 2013 at 3:50 p.m. to Dr. Freida Busman, who verbally acknowledged these results. Original Report Authenticated By: Fuller Canada, M.D.   Ct Cervical Spine Wo Contrast  03/03/2012 *RADIOLOGY REPORT* Clinical Data: Fall. Neck pain. History of Alzheimer's disease. CT CERVICAL SPINE WITHOUT CONTRAST  Technique: Multidetector CT imaging of the cervical spine was performed. Multiplanar CT image  reconstructions were also generated. Comparison: 02/01/2012 cervical spine CT. Findings: There is a levoconvex torticollis. Odontoid intact. Multilevel degenerative disease is present. Degenerative anterolisthesis of CT C3, C3 on C4 and retrolisthesis of C5 on C6 measuring about 2 mm at each level. Multilevel facet arthrosis is present. There is no cervical spine fracture. Multilevel foraminal stenosis associated with uncovertebral spurring and facet arthrosis. Incidental imaging the lung apices is within normal limits. Compared to prior, no interval change aside from the curvature which is probably positional. Concave contour of the T2 superior endplate is similar to prior with prominent Schmorl's node. IMPRESSION: Multilevel cervical spondylosis appears similar to prior. No acute osseous abnormality. Original Report Authenticated By: Andreas Newport, M.D.    Consultations:  Neurosurgery - Dr. Danielle Dess  Antibiotics:  None Discharge Exam:  Filed Vitals:    03/05/12 1436   BP:  140/62   Pulse:  64   Temp:  98.4 F (36.9 C)   Resp:  12    Filed Vitals:   Temp:  98.3 F (36.8 C)  98.5 F (36.9 C)  98.3 F (36.8 C)  98.4 F (36.9 C)   TempSrc:  Oral  Oral  Oral  Oral   Resp:  16  18  10  12    Height:       Weight:       SpO2:  98%  94%   98%    General: Pt is alert, follows commands appropriately, not in acute distress  Cardiovascular: Regular rate and rhythm, S1/S2 +, no murmurs, no rubs, no gallops  Respiratory: Clear to auscultation bilaterally, no wheezing, no crackles, no rhonchi  Abdominal: Soft, non tender, non distended, bowel sounds +, no guarding  Extremities: no edema, no cyanosis, pulses palpable bilaterally DP and PT  Neuro: Grossly nonfocal   Discharge Instructions  Discharge Orders    Future Appointments:  Provider:  Department:  Dept Phone:  Center:    04/18/2012 3:30 PM  Dava Najjar Idelle Jo  [facility]  [phone]  None    07/17/2012 3:15 PM  Dava Najjar Idelle Jo   [facility]  [phone]  None    07/17/2012 3:45 PM  Ladene Artist, MD  [facility]  [phone]  None        Medication List      As of 03/04/2012 6:32 PM     TAKE these medications        acetaminophen 500 MG tablet     Commonly known as: TYLENOL     Take 1 tablet (500 mg total) by mouth every 6 (six) hours as needed. pain     beta carotene w/minerals tablet     Take 1 tablet by mouth daily.     clonazePAM 0.5 MG tablet     Commonly known as: KLONOPIN     Take 0.5 tablets (0.25 mg total) by mouth daily as needed. For anxiety/sleep     darifenacin 7.5 MG 24 hr tablet     Commonly known as: ENABLEX     Take 2 tablets (15 mg total) by mouth daily.     imatinib 100 MG tablet     Commonly known as: GLEEVEC     Take 3 tablets (300 mg total) by mouth daily. Take with meals and large glass of water.Caution:Chemotherapy     levothyroxine 137 MCG tablet     Commonly known as: SYNTHROID, LEVOTHROID  Take 1 tablet (137 mcg total) by mouth daily.     metoprolol succinate 50 MG 24 hr tablet     Commonly known as: TOPROL-XL     Take 1 tablet (50 mg total) by mouth every morning.     multivitamin with minerals Tabs     Take 1 tablet by mouth every morning.     nitroGLYCERIN 0.3 MG SL tablet     Commonly known as: NITROSTAT     Place 1 tablet (0.3 mg total) under the tongue every 5 (five) minutes as needed. Chest pain     omeprazole 20 MG capsule     Commonly known as: PRILOSEC     Take 1 capsule (20 mg total) by mouth daily as needed. For heartburn     Oxcarbazepine 300 MG tablet     Commonly known as: TRILEPTAL     Take 1-2 tablets (300-600 mg total) by mouth 2 (two) times daily. Take 1 tablet by mouth in the morning and 2 tablets by mouth at bedtime.     PARoxetine 30 MG tablet     Commonly known as: PAXIL     Take 1 tablet (30 mg total) by mouth at bedtime.     terbinafine 250 MG tablet     Commonly known as: LAMISIL     Take 1 tablet (250 mg total) by mouth  daily.     VITAMIN E (TOPICAL) Oil     Apply 1 application topically daily. Apply to left big toe          Follow-up Information    Follow up with [name], MD. In 2 weeks.    Contact information:    [facility]  [address] [place] [address]  [phone]       Follow up with Stefani Dama, MD. In 2 weeks.    Contact information:    [address]  [place] [address]  [phone]         The results of significant diagnostics from this hospitalization (including imaging, microbiology, ancillary and laboratory) are listed below for reference.    Labs:  Basic Metabolic Panel:   Lab  03/03/12 1610   NA  140   K  4.7   CL  103   CO2  28   GLUCOSE  114*   BUN  25*   CREATININE  0.74   CALCIUM  10.3   MG  --   PHOS  --    Liver Function Tests:   Lab  03/03/12 1610   AST  25   ALT  14   ALKPHOS  79   BILITOT  0.2*   PROT  6.9   ALBUMIN  3.6     CBC:   Lab  03/03/12 1610   WBC  10.4   NEUTROABS  8.3*   HGB  12.6   HCT  37.1   MCV  94.6   PLT  273    Cardiac Enzymes:  No results found for this basename: CKTOTAL:5,CKMB:5,CKMBINDEX:5,TROPONINI:5 in the last 168 hours  BNP:  BNP (last 3 results)  No results found for this basename: PROBNP:3 in the last 8760 hours  CBG:  No results found for this basename: GLUCAP:5 in the last 168 hours    Tannie Koskela [phone]

## 2012-03-05 NOTE — Progress Notes (Signed)
Physical Therapy Treatment Patient Details Name: [name] MRN: [id number] DOB: 10/23/1927 Today's Date: 03/05/2012 Time: 0922-0958 PT Time Calculation (min): 36 min  PT Assessment / Plan / Recommendation Comments on Treatment Session  Pt doing better with mobiltiy during todays session, however seems to have increased confusion and agitation.  States that she would like to speak with MD and that she would like to know what is going on with her care and plans regarding D/C.     Follow Up Recommendations  Post acute inpatient;Supervision/Assistance - 24 hour;Home health PT     Does the patient have the potential to tolerate intense rehabilitation  No, Recommend SNF  Barriers to Discharge        Equipment Recommendations  None recommended by PT    Recommendations for Other Services    Frequency Min 3X/week   Plan Discharge plan needs to be updated    Precautions / Restrictions Precautions Precautions: Fall Restrictions Weight Bearing Restrictions: No   Pertinent Vitals/Pain Pt states that she just "aches" all over when she is moving, could not give it a number.     Mobility  Bed Mobility Bed Mobility: Not assessed (Pt in recliner when PT arrived. ) Transfers Transfers: Sit to Stand;Stand to Sit Sit to Stand: 4: Min assist;With upper extremity assist;With armrests;From chair/3-in-1;From toilet Stand to Sit: 4: Min assist;With upper extremity assist;With armrests;To chair/3-in-1;To toilet Details for Transfer Assistance: Assist to rise and steady with pt still demonstrating some impulsivity with getting up/sitting down.  Max cues for hand placement, technique and safety.  Ambulation/Gait Ambulation/Gait Assistance: 4: Min assist Ambulation Distance (Feet): 200 Feet Assistive device: Rolling walker Ambulation/Gait Assistance Details: Pt with somewhat increased gait speed with cues for safety.  Also cues for maintaining position inside of RW and safety with turning.     Gait Pattern: Step-through pattern;Decreased stride length;Trunk flexed;Right circumduction Gait velocity: Continue to note some R leg "lag" with ambulation.     Exercises     PT Diagnosis:    PT Problem List:   PT Treatment Interventions:     PT Goals Acute Rehab PT Goals PT Goal Formulation: With patient Time For Goal Achievement: 03/18/12 Potential to Achieve Goals: Good Pt will go Sit to Stand: with supervision PT Goal: Sit to Stand - Progress: Progressing toward goal Pt will go Stand to Sit: with supervision PT Goal: Stand to Sit - Progress: Progressing toward goal Pt will Ambulate: 51 - 150 feet;with supervision;with least restrictive assistive device PT Goal: Ambulate - Progress: Progressing toward goal  Visit Information  Last PT Received On: 03/05/12 Assistance Needed: +1    Subjective Data  Subjective: Sometimes I have loose bowels because I had part of my colon removed.  Patient Stated Goal: n/a   Cognition  Overall Cognitive Status: Impaired Area of Impairment: Safety/judgement;Awareness of errors;Awareness of deficits Arousal/Alertness: Awake/alert Orientation Level: Appears intact for tasks assessed Behavior During Session: Agitated Safety/Judgement: Decreased awareness of safety precautions;Decreased safety judgement for tasks assessed;Impulsive Awareness of Errors: Assistance required to identify errors made    Balance     End of Session PT - End of Session Activity Tolerance: Patient limited by fatigue Patient left: in chair;with call bell/phone within reach;with chair alarm set Nurse Communication: Mobility status   GP     Page, Meribeth Mattes 03/05/2012, 10:23 AM

## 2012-03-06 ENCOUNTER — Other Ambulatory Visit: Payer: Self-pay | Admitting: Lab

## 2012-03-06 ENCOUNTER — Ambulatory Visit: Payer: Self-pay | Admitting: Oncology

## 2012-03-27 ENCOUNTER — Ambulatory Visit (INDEPENDENT_AMBULATORY_CARE_PROVIDER_SITE_OTHER): Payer: Medicare Other | Admitting: Psychiatry

## 2012-03-27 ENCOUNTER — Encounter (HOSPITAL_COMMUNITY): Payer: Self-pay | Admitting: Psychiatry

## 2012-03-27 VITALS — BP 124/68 | HR 84 | Wt 176.0 lb

## 2012-03-27 DIAGNOSIS — F319 Bipolar disorder, unspecified: Secondary | ICD-10-CM

## 2012-03-27 DIAGNOSIS — F419 Anxiety disorder, unspecified: Secondary | ICD-10-CM

## 2012-03-27 MED ORDER — CLONAZEPAM 0.5 MG PO TABS: ORAL_TABLET | ORAL | Status: DC

## 2012-03-27 MED ORDER — OXCARBAZEPINE 300 MG PO TABS: 300.0000 mg | ORAL_TABLET | Freq: Two times a day (BID) | ORAL | Status: AC

## 2012-03-27 MED ORDER — PAROXETINE HCL 30 MG PO TABS: 30.0000 mg | ORAL_TABLET | Freq: Every day | ORAL | Status: AC

## 2012-03-27 NOTE — Progress Notes (Signed)
Patient ID: [name], female   DOB: Jun 18, 1927, 78 y.o.   MRN: [id number]  [facility] [id number] Progress Note  [name] [id number] 78 y.o.  03/27/2012 3:12 PM  Chief Complaint: Management and followup.  History of Present Illness:  Patient came in today for her followup appointment with her son.  Patient had missed her last appointment.  As per son patient has been admitted to the hospital due to to fall.  Patient has been falling frequently since January.  This is her seventh fall.  I review her records.  She has mild subarachnoid hemorrhage.  Her son is very concerned about her.  Patient is recently moved to assisted-living facility for a trial basis.  Patient acknowledged safety concern however she has heart time adjusting to the new facility.  She does not like the rules and regulation.  He does not like their meals and trying to make some friends.  Patient admitted irritability frustration and poor sleep.  I review her medication.  She is not taking Trileptal 600 mg at bedtime.  She's been given Trileptal only in the morning and afternoon.  On her last visit we had increased Paxil to 30 mg.  Patient is tolerating the medication.  She is feeling less depressed however due to new situation she feels her anxiety is getting worse.  She denies any violence or aggression but mood remains irritable and labile.  She endorse poor sleep and racing thoughts.  She denies any crying spells however she feels burden to her son.  She does not want to see therapist.  She denies any hallucination or any paranoid thinking.  She has not taken Klonopin in a while.  I review her blood test results which was done when she was hospitalized.  Her WBC count on October 21 is 10.4.  Her BUN is 25 and her glucose was 114.  She is a normal sodium, liver function test and creatinine.  Suicidal Ideation: No Plan Formed: No Patient has means to carry out plan: No  Homicidal Ideation: No Plan Formed: No  Patient has means to carry out plan: No  Review of Systems: Psychiatric: Agitation: Yes Hallucination: No Depressed Mood: Yes Insomnia: Yes Hypersomnia: No Altered Concentration: No Feels Worthless: No Grandiose Ideas: No Belief In Special Powers: No New/Increased Substance Abuse: No Compulsions: No  Neurologic: Headache: Yes Seizure: No Paresthesias: No  Past Psychiatric History; patient has a long history of psychiatric illness.  She she has history of postpartum depression and bipolar disorder.  She has multiple psychiatric inpatient treatment.  Posterior for psychiatric admission was to 2 agitation and mood swing.  In the past she has seen Dr. Sharyon Medicus, Leone Haven and Dr. Erling Cruz.  She had tried Wellbutrin Depakote with limited response.  Medical History; patient has history of colon cancer, hypertension, hypothyroidism, GERD and recent fall.  Family and Social History: Patient is widowed. Her husband died to see her.  She was living by herself however due to repeated fall , her son decided to move her to assisted-living facility.  She's going only for 30 day trial period.    Outpatient Encounter Prescriptions as of 03/27/2012  Medication Sig Dispense Refill  . beta carotene w/minerals (OCUVITE) tablet Take 1 tablet by mouth daily.      Marland Kitchen darifenacin (ENABLEX) 7.5 MG 24 hr tablet Take 2 tablets (15 mg total) by mouth daily.  60 tablet  1  . imatinib (GLEEVEC) 100 MG tablet Take 3 tablets (300 mg  total) by mouth daily. Take with meals and large glass of water.Caution:Chemotherapy  90 tablet  0  . levothyroxine (SYNTHROID, LEVOTHROID) 137 MCG tablet Take 1 tablet (137 mcg total) by mouth daily.  30 tablet  1  . metoprolol succinate (TOPROL-XL) 50 MG 24 hr tablet Take 1 tablet (50 mg total) by mouth every morning.  30 tablet  1  . Multiple Vitamin (MULTIVITAMIN WITH MINERALS) TABS Take 1 tablet by mouth every morning.       . nitroGLYCERIN (NITROSTAT) 0.3 MG SL tablet Place 1  tablet (0.3 mg total) under the tongue every 5 (five) minutes as needed. Chest pain  90 tablet  0  . omeprazole (PRILOSEC) 20 MG capsule Take 1 capsule (20 mg total) by mouth daily as needed. For heartburn  30 capsule  0  . Oxcarbazepine (TRILEPTAL) 300 MG tablet Take 1-2 tablets (300-600 mg total) by mouth 2 (two) times daily. Take 1 tablet by mouth in the morning and 2 tablets by mouth at bedtime.  270 tablet  0  . PARoxetine (PAXIL) 30 MG tablet Take 1 tablet (30 mg total) by mouth at bedtime.  90 tablet  0  . terbinafine (LAMISIL) 250 MG tablet Take 1 tablet (250 mg total) by mouth daily.  30 tablet  1  . VITAMIN E, TOPICAL, OIL Apply 1 application topically daily. Apply to left big toe      . [DISCONTINUED] Oxcarbazepine (TRILEPTAL) 300 MG tablet Take 1-2 tablets (300-600 mg total) by mouth 2 (two) times daily. Take 1 tablet by mouth in the morning and 2 tablets by mouth at bedtime.  60 tablet  1  . [DISCONTINUED] PARoxetine (PAXIL) 30 MG tablet Take 1 tablet (30 mg total) by mouth at bedtime.  30 tablet  1  . acetaminophen (TYLENOL) 500 MG tablet Take 1 tablet (500 mg total) by mouth every 6 (six) hours as needed. pain  30 tablet  1  . clonazePAM (KLONOPIN) 0.5 MG tablet Take 1/2 to 1 tab severe anxiety  30 tablet  0  . [DISCONTINUED] clonazePAM (KLONOPIN) 0.5 MG tablet Take 0.5 tablets (0.25 mg total) by mouth daily as needed. For anxiety/sleep  30 tablet  1   Facility-Administered Encounter Medications as of 03/27/2012  Medication Dose Route Frequency Provider Last Rate Last Dose  . nitroGLYCERIN (NITROSTAT) SL tablet 0.3 mg  0.3 mg Sublingual Q5 Min x 3 PRN Phillips Odor, MD        No results found for this or any previous visit (from the past 72 hour(s)).  Past Psychiatric History/Hospitalization(s): Anxiety: Yes Bipolar Disorder: Yes Depression: Yes Mania: Yes Psychosis: No Schizophrenia: No Personality Disorder: No Hospitalization for psychiatric illness: Yes History of  Electroconvulsive Shock Therapy: No Prior Suicide Attempts: No  Physical Exam: Constitutional:  BP 124/68  Pulse 84  Wt 176 lb (79.833 kg)  Musculoskeletal: Strength & Muscle Tone: decreased Gait & Station: unsteady Patient leans: Backward  Mental Status Examination; patient is casually dressed and fairly groomed.  She uses wheelchair as she cannot walk very well.  She's easily irritable during conversation.  She maintained poor eye contact.  Her speech is fast pressure at times rambling with increased tone and volume.  Her thought process is also circumstantial.  She denies any active or passive suicidal thoughts or homicidal thoughts.  She denies any auditory or visual hallucination.  There were no tremors or shakes present at this time.  Her fund of knowledge is fair.  She is difficult to engage  in conversation and requires constant redirection.  There were no psychotic symptoms present at this time.  Her attention and concentration is poor.  She's alert oriented x3.  Her psychomotor activity is slightly increased.  Her insight judgment and impulse control is fair.   Medical Decision Making (Choose Three): Review of Psycho-Social Stressors (1), Review or order clinical lab tests (1), Review of Medication Regimen & Side Effects (2) and Review of New Medication or Change in Dosage (2)  Assessment: Axis I: Bipolar disorder 1  Axis II: Deferred  Axis III: See medical history  Axis IV: Moderate  Axis V: 50-55   Plan: I review patient medication, last discharge summary from the hospital and recent blood work results.  Patient is moving to assisted living facility for trial basis.  She is having a hard time adjusting to new facility.  I review her medication.  She is not getting Trileptal dosage as prescribed.  I will reorder Trileptal 300 mg 1 in the morning and 600 mg at bedtime.  I recommend to use Klonopin only for severe anxiety as this medicine can cause sedation and risk of fall.   Her sodium level is okay.  Patient would require paperwork to be completed for her assisted living facility.  Will complete the paperwork.  I recommend to call us if she is any question or concern about the medication if she feels worsening of the symptom.  A 90 day prescription of Paxil and Trileptal is given.  I also gave thirty-day precision of Klonopin recommendation to take 0.5 mg half to one tablet only for severe anxiety.  I will see her again in 3 months.  Shantana Christon T., MD 03/27/2012

## 2012-03-28 ENCOUNTER — Telehealth: Payer: Self-pay | Admitting: *Deleted

## 2012-03-28 MED ORDER — IMATINIB MESYLATE 100 MG PO TABS: 300.0000 mg | ORAL_TABLET | Freq: Every day | ORAL | Status: DC

## 2012-03-28 NOTE — Telephone Encounter (Signed)
Message from pt's son. Questions about Gleevec. Returned call, [name] is concerned that [facility] is not administering the Gleevec as ordered. Requesting an order to be faxed clarifying her 300 mg once daily dosing. They are currently dividing dose and giving 100 mg three times daily. Spoke with Eunice Blase, they will need a new rx faxed over before they can make any adjustments to the Gleevec administration schedule.

## 2012-03-30 ENCOUNTER — Encounter (HOSPITAL_COMMUNITY): Payer: Self-pay | Admitting: *Deleted

## 2012-03-30 ENCOUNTER — Emergency Department (HOSPITAL_COMMUNITY): Payer: Medicare Other

## 2012-03-30 ENCOUNTER — Inpatient Hospital Stay (HOSPITAL_COMMUNITY)
Admission: EM | Admit: 2012-03-30 | Discharge: 2012-04-07 | DRG: 085 | Disposition: A | Discharge status: Skilled Nursing Facility | Payer: Medicare Other | Attending: Internal Medicine | Admitting: Internal Medicine

## 2012-03-30 DIAGNOSIS — Y9210 Unspecified residential institution as the place of occurrence of the external cause: Secondary | ICD-10-CM | POA: Diagnosis present

## 2012-03-30 DIAGNOSIS — E079 Disorder of thyroid, unspecified: Secondary | ICD-10-CM | POA: Diagnosis present

## 2012-03-30 DIAGNOSIS — I10 Essential (primary) hypertension: Secondary | ICD-10-CM | POA: Diagnosis present

## 2012-03-30 DIAGNOSIS — E039 Hypothyroidism, unspecified: Secondary | ICD-10-CM | POA: Diagnosis present

## 2012-03-30 DIAGNOSIS — Z66 Do not resuscitate: Secondary | ICD-10-CM | POA: Diagnosis not present

## 2012-03-30 DIAGNOSIS — E876 Hypokalemia: Secondary | ICD-10-CM | POA: Diagnosis present

## 2012-03-30 DIAGNOSIS — I619 Nontraumatic intracerebral hemorrhage, unspecified: Secondary | ICD-10-CM

## 2012-03-30 DIAGNOSIS — S066X0A Traumatic subarachnoid hemorrhage without loss of consciousness, initial encounter: Principal | ICD-10-CM | POA: Diagnosis present

## 2012-03-30 DIAGNOSIS — I447 Left bundle-branch block, unspecified: Secondary | ICD-10-CM

## 2012-03-30 DIAGNOSIS — F329 Major depressive disorder, single episode, unspecified: Secondary | ICD-10-CM

## 2012-03-30 DIAGNOSIS — D649 Anemia, unspecified: Secondary | ICD-10-CM | POA: Diagnosis present

## 2012-03-30 DIAGNOSIS — W1809XA Striking against other object with subsequent fall, initial encounter: Secondary | ICD-10-CM | POA: Diagnosis present

## 2012-03-30 DIAGNOSIS — Z87891 Personal history of nicotine dependence: Secondary | ICD-10-CM

## 2012-03-30 DIAGNOSIS — G9349 Other encephalopathy: Secondary | ICD-10-CM | POA: Diagnosis present

## 2012-03-30 DIAGNOSIS — F32A Depression, unspecified: Secondary | ICD-10-CM | POA: Diagnosis present

## 2012-03-30 DIAGNOSIS — F419 Anxiety disorder, unspecified: Secondary | ICD-10-CM

## 2012-03-30 DIAGNOSIS — R531 Weakness: Secondary | ICD-10-CM

## 2012-03-30 DIAGNOSIS — I609 Nontraumatic subarachnoid hemorrhage, unspecified: Secondary | ICD-10-CM | POA: Diagnosis present

## 2012-03-30 DIAGNOSIS — F319 Bipolar disorder, unspecified: Secondary | ICD-10-CM | POA: Diagnosis present

## 2012-03-30 DIAGNOSIS — Z79899 Other long term (current) drug therapy: Secondary | ICD-10-CM

## 2012-03-30 DIAGNOSIS — I200 Unstable angina: Secondary | ICD-10-CM

## 2012-03-30 DIAGNOSIS — G8190 Hemiplegia, unspecified affecting unspecified side: Secondary | ICD-10-CM | POA: Diagnosis not present

## 2012-03-30 DIAGNOSIS — Z85038 Personal history of other malignant neoplasm of large intestine: Secondary | ICD-10-CM | POA: Diagnosis present

## 2012-03-30 DIAGNOSIS — Z96649 Presence of unspecified artificial hip joint: Secondary | ICD-10-CM

## 2012-03-30 DIAGNOSIS — R5381 Other malaise: Secondary | ICD-10-CM | POA: Diagnosis present

## 2012-03-30 DIAGNOSIS — C9210 Chronic myeloid leukemia, BCR/ABL-positive, not having achieved remission: Secondary | ICD-10-CM | POA: Diagnosis present

## 2012-03-30 DIAGNOSIS — K219 Gastro-esophageal reflux disease without esophagitis: Secondary | ICD-10-CM | POA: Diagnosis present

## 2012-03-30 DIAGNOSIS — W19XXXA Unspecified fall, initial encounter: Secondary | ICD-10-CM | POA: Diagnosis present

## 2012-03-30 DIAGNOSIS — F39 Unspecified mood [affective] disorder: Secondary | ICD-10-CM | POA: Diagnosis present

## 2012-03-30 LAB — CBC WITH DIFFERENTIAL/PLATELET
HCT: 34.5 % — ABNORMAL LOW (ref 36.0–46.0)
Hemoglobin: 11.4 g/dL — ABNORMAL LOW (ref 12.0–15.0)
Lymphocytes Relative: 20 % (ref 12–46)
MCHC: 33 g/dL (ref 30.0–36.0)
Monocytes Absolute: 0.6 10*3/uL (ref 0.1–1.0)
Monocytes Relative: 9 % (ref 3–12)
Neutro Abs: 4.1 10*3/uL (ref 1.7–7.7)
WBC: 5.9 10*3/uL (ref 4.0–10.5)

## 2012-03-30 LAB — BASIC METABOLIC PANEL
BUN: 14 mg/dL (ref 6–23)
CO2: 26 mEq/L (ref 19–32)
Chloride: 103 mEq/L (ref 96–112)
Creatinine, Ser: 0.77 mg/dL (ref 0.50–1.10)

## 2012-03-30 MED ORDER — ACETAMINOPHEN 650 MG RE SUPP
650.0000 mg | Freq: Four times a day (QID) | RECTAL | Status: DC | PRN
  Administered 2012-04-04: 650 mg via RECTAL
  Filled 2012-03-30: qty 1

## 2012-03-30 MED ORDER — CELECOXIB 200 MG PO CAPS
200.0000 mg | ORAL_CAPSULE | Freq: Every day | ORAL | Status: DC
  Administered 2012-03-31: 200 mg via ORAL
  Filled 2012-03-30 (×2): qty 1

## 2012-03-30 MED ORDER — NITROGLYCERIN 0.4 MG SL SUBL: 0.4000 mg | SUBLINGUAL_TABLET | SUBLINGUAL | Status: DC | PRN

## 2012-03-30 MED ORDER — ADULT MULTIVITAMIN W/MINERALS CH
1.0000 | ORAL_TABLET | Freq: Every morning | ORAL | Status: DC
  Administered 2012-03-31 – 2012-04-07 (×6): 1 via ORAL
  Filled 2012-03-30 (×9): qty 1

## 2012-03-30 MED ORDER — PANTOPRAZOLE SODIUM 40 MG PO TBEC
40.0000 mg | DELAYED_RELEASE_TABLET | Freq: Every day | ORAL | Status: DC
  Administered 2012-03-31: 40 mg via ORAL
  Filled 2012-03-30 (×2): qty 1

## 2012-03-30 MED ORDER — PAROXETINE HCL 30 MG PO TABS
30.0000 mg | ORAL_TABLET | Freq: Every day | ORAL | Status: DC
  Administered 2012-03-31 – 2012-04-06 (×7): 30 mg via ORAL
  Filled 2012-03-30 (×10): qty 1

## 2012-03-30 MED ORDER — METOPROLOL SUCCINATE ER 50 MG PO TB24
50.0000 mg | ORAL_TABLET | Freq: Every morning | ORAL | Status: DC
  Administered 2012-03-31: 50 mg via ORAL
  Filled 2012-03-30 (×2): qty 1

## 2012-03-30 MED ORDER — SODIUM CHLORIDE 0.9 % IV SOLN: INTRAVENOUS | Status: AC

## 2012-03-30 MED ORDER — LEVOTHYROXINE SODIUM 137 MCG PO TABS
137.0000 ug | ORAL_TABLET | Freq: Every day | ORAL | Status: DC
  Administered 2012-03-31: 137 ug via ORAL
  Filled 2012-03-30 (×4): qty 1

## 2012-03-30 MED ORDER — ONDANSETRON HCL 4 MG/2ML IJ SOLN
4.0000 mg | Freq: Four times a day (QID) | INTRAMUSCULAR | Status: DC | PRN
  Administered 2012-04-01 – 2012-04-02 (×2): 4 mg via INTRAVENOUS
  Filled 2012-03-30 (×2): qty 2

## 2012-03-30 MED ORDER — DARIFENACIN HYDROBROMIDE ER 15 MG PO TB24
15.0000 mg | ORAL_TABLET | Freq: Every day | ORAL | Status: DC
  Administered 2012-03-31 – 2012-04-07 (×6): 15 mg via ORAL
  Filled 2012-03-30 (×9): qty 1

## 2012-03-30 MED ORDER — OXCARBAZEPINE 300 MG PO TABS
600.0000 mg | ORAL_TABLET | Freq: Every day | ORAL | Status: DC
  Administered 2012-03-31 – 2012-04-06 (×7): 600 mg via ORAL
  Filled 2012-03-30 (×11): qty 2

## 2012-03-30 MED ORDER — POTASSIUM CHLORIDE CRYS ER 20 MEQ PO TBCR
40.0000 meq | EXTENDED_RELEASE_TABLET | Freq: Once | ORAL | Status: AC
  Administered 2012-03-31: 40 meq via ORAL
  Filled 2012-03-30: qty 2

## 2012-03-30 MED ORDER — ACETAMINOPHEN 325 MG PO TABS
650.0000 mg | ORAL_TABLET | Freq: Four times a day (QID) | ORAL | Status: DC | PRN
  Administered 2012-03-31: 650 mg via ORAL
  Filled 2012-03-30: qty 2

## 2012-03-30 MED ORDER — VITAMIN E EX OIL: 1.0000 "application " | TOPICAL_OIL | Freq: Every day | CUTANEOUS | Status: DC

## 2012-03-30 MED ORDER — ONDANSETRON HCL 4 MG PO TABS: 4.0000 mg | ORAL_TABLET | Freq: Four times a day (QID) | ORAL | Status: DC | PRN

## 2012-03-30 MED ORDER — IMATINIB MESYLATE 100 MG PO TABS: 300.0000 mg | ORAL_TABLET | Freq: Every day | ORAL | Status: DC

## 2012-03-30 MED ORDER — OCUVITE PO TABS
1.0000 | ORAL_TABLET | Freq: Every day | ORAL | Status: DC
  Administered 2012-03-31 – 2012-04-07 (×6): 1 via ORAL
  Filled 2012-03-30 (×10): qty 1

## 2012-03-30 NOTE — ED Notes (Signed)
[id number]<AV> Expected date:03/30/12<BR> Expected time: 5:13 PM<BR> Means of arrival:Ambulance<BR> Comments:<BR> nsg home pt
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
## 2012-03-30 NOTE — ED Provider Notes (Signed)
History     CSN: [id number]  Arrival date & time 03/30/12  1730   First MD Initiated Contact with Patient 03/30/12 1754      Chief Complaint  Patient presents with  . Fall    (Consider location/radiation/quality/duration/timing/severity/associated sxs/prior treatment) Patient is a 78 y.o. female presenting with fall. The history is provided by the patient.  Fall Pertinent negatives include no numbness, no abdominal pain and no headaches.  patient with a repeat fall. History of same. Hit the right side of her head. No LOC. May have had some "shaking" that resolved when she rolled to her back. Patient is complaining of right but of pain. She states she hit the side of her head. She also has had some mild right neck pain. She states she had her shoulder but it does not hurt. No chest or abdominal pain. No numbness or weakness. There is no confusion with the event.  Past Medical History  Diagnosis Date  . Thyroid disease   . HTN (hypertension)   . GERD (gastroesophageal reflux disease)   . Depression   . Bipolar 1 disorder   . Cancer     colon ca  . CML (chronic myelocytic leukemia)     Past Surgical History  Procedure Date  . Colon surgery   . Abdominal hysterectomy   . Cholecystectomy   . Total hip arthroplasty     bilateral    Family History  Problem Relation Age of Onset  . Depression Mother     History  Substance Use Topics  . Smoking status: Former Smoker    Quit date: 03/04/1959  . Smokeless tobacco: Never Used  . Alcohol Use: No     Comment: very seldom    OB History    Grav Para Term Preterm Abortions TAB SAB Ect Mult Living                  Review of Systems  Constitutional: Negative for fatigue.  Respiratory: Negative for chest tightness and shortness of breath.   Cardiovascular: Negative for chest pain.  Gastrointestinal: Negative for abdominal pain.  Musculoskeletal: Negative for back pain and joint swelling.  Neurological: Negative for  tremors, syncope, numbness and headaches.  Psychiatric/Behavioral: Negative for confusion.    Allergies  Stelazine  Home Medications   Current Outpatient Rx  Name  Route  Sig  Dispense  Refill  . ACETAMINOPHEN 500 MG PO TABS   Oral   Take 1 tablet (500 mg total) by mouth every 6 (six) hours as needed. pain   30 tablet   1   . OCUVITE PO TABS   Oral   Take 1 tablet by mouth daily.         . CELECOXIB 200 MG PO CAPS   Oral   Take 200 mg by mouth daily.         Marland Kitchen CLONAZEPAM 0.5 MG PO TABS      Take 1/2 to 1 tab severe anxiety   30 tablet   0   . DARIFENACIN HYDROBROMIDE ER 15 MG PO TB24   Oral   Take 15 mg by mouth daily.         . IMATINIB MESYLATE 100 MG PO TABS   Oral   Take 300 mg by mouth daily. Take with meals and large glass of water.Caution:Chemotherapy         . LEVOTHYROXINE SODIUM 137 MCG PO TABS   Oral   Take 1 tablet (137 mcg total)  by mouth daily.   30 tablet   1   . METOPROLOL SUCCINATE ER 50 MG PO TB24   Oral   Take 1 tablet (50 mg total) by mouth every morning.   30 tablet   1   . ADULT MULTIVITAMIN W/MINERALS CH   Oral   Take 1 tablet by mouth every morning.          Marland Kitchen OMEPRAZOLE 20 MG PO CPDR   Oral   Take 1 capsule (20 mg total) by mouth daily as needed. For heartburn   30 capsule   0   . OXCARBAZEPINE 300 MG PO TABS   Oral   Take 1-2 tablets (300-600 mg total) by mouth 2 (two) times daily. Take 1 tablet by mouth in the morning and 2 tablets by mouth at bedtime.   270 tablet   0   . PAROXETINE HCL 30 MG PO TABS   Oral   Take 1 tablet (30 mg total) by mouth at bedtime.   90 tablet   0   . VITAMIN E EX OIL   Apply externally   Apply 1 application topically daily. Apply to left big toe         . NITROGLYCERIN 0.3 MG SL SUBL   Sublingual   Place 1 tablet (0.3 mg total) under the tongue every 5 (five) minutes as needed. Chest pain   90 tablet   0     BP 146/87  Pulse 69  Temp 98.8 F (37.1 C) (Oral)  Resp  15  SpO2 96%  Physical Exam  Nursing note and vitals reviewed. Constitutional: She is oriented to person, place, and time. She appears well-developed and well-nourished.  HENT:  Head: Normocephalic.       Minimal right temporal area.  Eyes: EOM are normal. Pupils are equal, round, and reactive to light.  Neck: Normal range of motion. Neck supple.  Cardiovascular: Normal rate, regular rhythm and normal heart sounds.   No murmur heard. Pulmonary/Chest: Effort normal and breath sounds normal. No respiratory distress. She has no wheezes. She has no rales.  Abdominal: Soft. Bowel sounds are normal. She exhibits no distension. There is no tenderness. There is no rebound and no guarding.  Musculoskeletal: Normal range of motion.  Neurological: She is alert and oriented to person, place, and time. No cranial nerve deficit.  Skin: Skin is warm and dry.       Small abrasion to right shoulder. No tenderness. Range of motion intact at shoulder.  Psychiatric: She has a normal mood and affect. Her speech is normal.    ED Course  Procedures (including critical care time)  Labs Reviewed  CBC WITH DIFFERENTIAL - Abnormal; Notable for the following:    RBC 3.63 (*)     Hemoglobin 11.4 (*)     HCT 34.5 (*)     All other components within normal limits  BASIC METABOLIC PANEL - Abnormal; Notable for the following:    Potassium 3.3 (*)     Glucose, Bld 122 (*)     GFR calc non Af Amer 76 (*)     GFR calc Af Amer 88 (*)     All other components within normal limits  PROTIME-INR  APTT   Ct Head Wo Contrast  03/30/2012  *RADIOLOGY REPORT*  Clinical Data: Larey Seat, history colon cancer, hypertension, bipolar disorder, CML  CT HEAD WITHOUT CONTRAST  Technique:  Contiguous axial images were obtained from the base of the skull through the vertex  without contrast.  Comparison: 03/03/2012  Findings: Generalized atrophy. Stable ventricular morphology. No midline shift or mass effect. Small vessel chronic  ischemic changes of deep cerebral white matter. Few scattered foci of subarachnoid blood are seen at the hemispheres bilaterally. These appear more posterior than the foci of subarachnoid blood which were seen on the previous study. In addition there is questionably a small amount of blood at the left tentorium, question subarachnoid versus minimal subdural blood, new. No intraparenchymal hemorrhage, mass lesion or evidence of acute infarction. Visualized paranasal sinuses and mastoid air cells clear. Extensive atherosclerotic calcifications at skull base. Calvaria appear intact.  IMPRESSION: Atrophy with small vessel chronic ischemic changes of deep cerebral white matter. Few scattered foci of subarachnoid hemorrhage are seen within sulci at hemispheres bilaterally with an additional small amount of blood at the free edge of the left tentorium, question subarachnoid versus minimal subdural blood.  Critical Value/emergent results were called by telephone at the time of interpretation on 03/30/2012 at 1930 hours to Dr. Rubin Payor, who verbally acknowledged these results.   Original Report Authenticated By: Ulyses Southward, M.D.      1. Subarachnoid hemorrhage   2. Fall   3. Thyroid disease   4. HTN (hypertension)   5. GERD (gastroesophageal reflux disease)   6. Depression   7. Bipolar 1 disorder   8. CML (chronic myelocytic leukemia)   9. Anxiety   10. Unstable angina   11. Left bundle branch block       MDM  Patient with a fall. History of mechanical falls. Patient has subarachnoid hemorrhages. She's recently had similar bleeds after fall. After discussion with neurosurgery patient be admitted to medicine for evaluation overnight.        Juliet Rude. Rubin Payor, MD 03/30/12 2308

## 2012-03-30 NOTE — ED Notes (Signed)
Per EMS: pt from assisted living facility where she fell while trying to open the door, per facility's policy every fall has to be followed up in the ed. Per EMS pt only complaint is achy shoulder.

## 2012-03-31 DIAGNOSIS — F411 Generalized anxiety disorder: Secondary | ICD-10-CM

## 2012-03-31 DIAGNOSIS — D649 Anemia, unspecified: Secondary | ICD-10-CM

## 2012-03-31 LAB — BASIC METABOLIC PANEL
CO2: 25 mEq/L (ref 19–32)
Chloride: 105 mEq/L (ref 96–112)
Sodium: 138 mEq/L (ref 135–145)

## 2012-03-31 LAB — CBC
HCT: 33.4 % — ABNORMAL LOW (ref 36.0–46.0)
Hemoglobin: 10.9 g/dL — ABNORMAL LOW (ref 12.0–15.0)
MCV: 95.2 fL (ref 78.0–100.0)
RBC: 3.51 MIL/uL — ABNORMAL LOW (ref 3.87–5.11)
WBC: 4.6 10*3/uL (ref 4.0–10.5)

## 2012-03-31 LAB — IRON AND TIBC
Iron: 85 ug/dL (ref 42–135)
Saturation Ratios: 30 % (ref 20–55)
UIBC: 194 ug/dL (ref 125–400)

## 2012-03-31 MED ORDER — OXCARBAZEPINE 300 MG PO TABS
300.0000 mg | ORAL_TABLET | Freq: Every day | ORAL | Status: DC
  Administered 2012-03-31 – 2012-04-07 (×6): 300 mg via ORAL
  Filled 2012-03-31 (×9): qty 1

## 2012-03-31 NOTE — Evaluation (Signed)
Occupational Therapy Evaluation Patient Details Name: RIELEY KHALSA MRN: [id number] DOB: [birth year] Today's Date: 03/31/2012 Time: 5784-6962 OT Time Calculation (min): 33 min  OT Assessment / Plan / Recommendation Clinical Impression  Pt is an 78 yo female who presents with multiple falls at her ALF. Feel pt would benefit from ongoing therapy at snf to improve balance, activity tolerance and safe ADL performance. Do not feel pt is safe to return to ALF at this point. Will follow acutely to address the below mentioned problems.    OT Assessment  Patient needs continued OT Services    Follow Up Recommendations  SNF    Barriers to Discharge Decreased caregiver support    Equipment Recommendations  None recommended by OT    Recommendations for Other Services    Frequency  Min 2X/week    Precautions / Restrictions Precautions Precautions: Fall Restrictions Weight Bearing Restrictions: No   Pertinent Vitals/Pain Pt reported minimal pain in R side. Repositioned for comfort.    ADL  Grooming: Simulated;Set up Where Assessed - Grooming: Unsupported sitting Upper Body Bathing: Simulated;Minimal assistance Where Assessed - Upper Body Bathing: Unsupported sitting Lower Body Bathing: Simulated;Moderate assistance Where Assessed - Lower Body Bathing: Supported sit to stand Upper Body Dressing: Simulated;Minimal assistance Where Assessed - Upper Body Dressing: Unsupported sitting Lower Body Dressing: Simulated;Moderate assistance Where Assessed - Lower Body Dressing: Supported sit to Pharmacist, hospital: Simulated;Moderate assistance Toilet Transfer Method: Sit to stand Toileting - Clothing Manipulation and Hygiene: Other (comment) (recliner) Equipment Used: Rolling walker;Gait belt Transfers/Ambulation Related to ADLs: Pt ambulated with min A and max directional/safety cues. ADL Comments: Pt fatigues quickly    OT Diagnosis: Generalized weakness  OT Problem List: Decreased  strength;Decreased safety awareness;Decreased activity tolerance;Decreased knowledge of use of DME or AE OT Treatment Interventions: Self-care/ADL training;Therapeutic activities;DME and/or AE instruction;Patient/family education   OT Goals Acute Rehab OT Goals OT Goal Formulation: With patient/family Time For Goal Achievement: 04/14/12 Potential to Achieve Goals: Good ADL Goals Pt Will Perform Grooming: with supervision;Standing at sink ADL Goal: Grooming - Progress: Goal set today Pt Will Transfer to Toilet: with supervision;3-in-1;Comfort height toilet;Ambulation ADL Goal: Toilet Transfer - Progress: Goal set today Pt Will Perform Toileting - Clothing Manipulation: with supervision;Standing;Sitting on 3-in-1 or toilet ADL Goal: Toileting - Clothing Manipulation - Progress: Goal set today Pt Will Perform Toileting - Hygiene: with supervision;Sit to stand from 3-in-1/toilet ADL Goal: Toileting - Hygiene - Progress: Goal set today Additional ADL Goal #1: Pt will complete all aspects of bathing and dressing with setup/min A. ADL Goal: Additional Goal #1 - Progress: Goal set today  Visit Information  Last OT Received On: 03/31/12 Assistance Needed: +2 (safety)    Subjective Data  Subjective: I was shaking really bad when I fell. Patient Stated Goal: Not asked.   Prior Functioning     Home Living Lives With: Other (Comment) (ALF) Available Help at Discharge: Skilled Nursing Facility Type of Home: Assisted living Home Access: Level entry Home Adaptive Equipment: Walker - standard;Bedside commode/3-in-1 Prior Function Level of Independence: Needs assistance Able to Take Stairs?: No Driving: No Vocation: Retired Comments: Pt did not elaborate on how much assist she needs at the ALF. Communication Communication: HOH Dominant Hand: Right         Vision/Perception     Cognition  Overall Cognitive Status: Impaired Area of Impairment: Safety/judgement;Awareness of  errors;Awareness of deficits Arousal/Alertness: Awake/alert Orientation Level: Appears intact for tasks assessed Behavior During Session: [facility] for tasks performed Safety/Judgement: Decreased awareness  of safety precautions;Decreased safety judgement for tasks assessed;Impulsive;Decreased awareness of need for assistance Awareness of Errors: Assistance required to identify errors made;Assistance required to correct errors made Awareness of Errors - Other Comments: Pt constantly veering to either R or L and would let RW get too far in front of her.  Required assist and cues to correct each time.     Extremity/Trunk Assessment Right Upper Extremity Assessment RUE ROM/Strength/Tone: Deficits RUE ROM/Strength/Tone Deficits: generalized weakness. Left Upper Extremity Assessment LUE ROM/Strength/Tone: Deficits LUE ROM/Strength/Tone Deficits: Pt demo's only about 45* of shoulder flexion/abduction. Generalized weakness at elbow and distally Right Lower Extremity Assessment RLE ROM/Strength/Tone: Deficits RLE ROM/Strength/Tone Deficits: Pt tends to favor R side when ambulating and note that she externally rotates leg at all times, causing her foot to sometimes be outside of RW when ambulating.  RLE Sensation: WFL - Light Touch Left Lower Extremity Assessment LLE ROM/Strength/Tone: WFL for tasks assessed LLE Sensation: WFL - Light Touch Trunk Assessment Trunk Assessment: Kyphotic     Mobility Bed Mobility Bed Mobility: Supine to Sit;Sitting - Scoot to Edge of Bed Supine to Sit: 4: Min assist Sitting - Scoot to [place] of Bed: 4: Min assist Details for Bed Mobility Assistance: Pt requires some assist for LEs getting to EOB with gaurding assist for safety at trunk.  cues for hand placement and technique.  Transfers Sit to Stand: 3: Mod assist;With upper extremity assist;From bed Stand to Sit: 3: Mod assist;With upper extremity assist;With armrests;To chair/3-in-1 Details for Transfer Assistance:  Performed x 2 with pt at min assist second sit to stand from chair, however from bed required increased assist with max cues for hand placement and safety when sitting/standing.       Shoulder Instructions     Exercise     Balance     End of Session OT - End of Session Equipment Utilized During Treatment: Gait belt Activity Tolerance: Patient tolerated treatment well Patient left: in chair;with call bell/phone within reach;with chair alarm set;with family/visitor present  GO     Carlina Derks A OTR/L [phone] 03/31/2012, 12:39 PM

## 2012-03-31 NOTE — Evaluation (Signed)
Physical Therapy Evaluation Patient Details Name: [name] MRN: [id number] DOB: [birth year] Today's Date: 03/31/2012 Time: 0865-7846 PT Time Calculation (min): 33 min  PT Assessment / Plan / Recommendation Clinical Impression  Pt presents with fall at [facility] trying to open door using RW.  She states that she started shaking and then fell.  This is pts third fall in 60 days per pts son at ALF.  Noted pt has pending neuro consult ordered.  Pt will benefit from skilled PT in acute venue to address deficits.  PT recommends SNF for follow up at D/C for increased pt safety and decrease risk of falls.     PT Assessment  Patient needs continued PT services    Follow Up Recommendations  SNF;Supervision/Assistance - 24 hour    Does the patient have the potential to tolerate intense rehabilitation      Barriers to Discharge Decreased caregiver support      Equipment Recommendations  None recommended by PT    Recommendations for Other Services     Frequency Min 3X/week    Precautions / Restrictions Precautions Precautions: Fall Restrictions Weight Bearing Restrictions: No   Pertinent Vitals/Pain Some pain in R lower back      Mobility  Bed Mobility Bed Mobility: Supine to Sit;Sitting - Scoot to Edge of Bed Supine to Sit: 4: Min assist Sitting - Scoot to [place] of Bed: 4: Min assist Details for Bed Mobility Assistance: Pt requires some assist for LEs getting to EOB with gaurding assist for safety at trunk.  cues for hand placement and technique.  Transfers Transfers: Sit to Stand;Stand to Sit Sit to Stand: 3: Mod assist;With upper extremity assist;From bed Stand to Sit: 3: Mod assist;With upper extremity assist;With armrests;To chair/3-in-1 Details for Transfer Assistance: Performed x 2 with pt at min assist second sit to stand from chair, however from bed required increased assist with max cues for hand placement and safety when sitting/standing.    Ambulation/Gait Ambulation/Gait Assistance: 4: Min assist;3: Mod assist Ambulation Distance (Feet): 100 Feet Assistive device: Rolling walker Ambulation/Gait Assistance Details: Pt requires contstant assist and cues for negotiating RW and maintaining proper positioning inside of RW.  Pt tended to veer left and right and let RW get too far in front of her.  she also demos RLE external rotation and sometimes foot would get outside of RW.   Gait Pattern: Step-through pattern;Decreased stride length;Trunk flexed;Trendelenburg Gait velocity: sometimes increased, with cues for slower gait speed for increased safety.  Stairs: No Wheelchair Mobility Wheelchair Mobility: No    Shoulder Instructions     Exercises     PT Diagnosis: Difficulty walking;Generalized weakness;Acute pain  PT Problem List: Decreased strength;Decreased activity tolerance;Decreased balance;Decreased mobility;Decreased coordination;Decreased knowledge of use of DME;Decreased safety awareness;Decreased knowledge of precautions;Pain PT Treatment Interventions: DME instruction;Gait training;Functional mobility training;Therapeutic activities;Therapeutic exercise;Balance training;Patient/family education   PT Goals Acute Rehab PT Goals PT Goal Formulation: With patient/family Time For Goal Achievement: 04/14/12 Potential to Achieve Goals: Good Pt will go Supine/Side to Sit: with supervision PT Goal: Supine/Side to Sit - Progress: Goal set today Pt will go Sit to Supine/Side: with supervision PT Goal: Sit to Supine/Side - Progress: Goal set today Pt will go Sit to Stand: with supervision PT Goal: Sit to Stand - Progress: Goal set today Pt will go Stand to Sit: with supervision PT Goal: Stand to Sit - Progress: Goal set today Pt will Ambulate: 51 - 150 feet;with supervision;with least restrictive assistive device PT Goal: Ambulate -  Progress: Goal set today  Visit Information  Last PT Received On: 03/31/12 Assistance  Needed: +2 (safety) PT/OT Co-Evaluation/Treatment: Yes    Subjective Data  Subjective: I want to know exactly what the situation is.  Patient Stated Goal: n/a   Prior Functioning  Home Living Lives With: Other (Comment) (ALF) Available Help at Discharge: Skilled Nursing Facility Type of Home: Assisted living Home Access: Level entry Home Adaptive Equipment: Walker - standard;Bedside commode/3-in-1 Prior Function Level of Independence: Needs assistance Able to Take Stairs?: No Driving: No Vocation: Retired Comments: Pt did not elaborate on how much assist she needs at the ALF. Communication Communication: HOH Dominant Hand: Right    Cognition  Overall Cognitive Status: Impaired Area of Impairment: Safety/judgement;Awareness of errors;Awareness of deficits Arousal/Alertness: Awake/alert Orientation Level: Appears intact for tasks assessed Behavior During Session: [facility] for tasks performed Safety/Judgement: Decreased awareness of safety precautions;Decreased safety judgement for tasks assessed;Impulsive;Decreased awareness of need for assistance Awareness of Errors: Assistance required to identify errors made;Assistance required to correct errors made Awareness of Errors - Other Comments: Pt constantly veering to either R or L and would let RW get too far in front of her.  Required assist and cues to correct each time.     Extremity/Trunk Assessment Right Upper Extremity Assessment RUE ROM/Strength/Tone: Deficits RUE ROM/Strength/Tone Deficits: generalized weakness. Left Upper Extremity Assessment LUE ROM/Strength/Tone: Deficits LUE ROM/Strength/Tone Deficits: Pt demo's only about 45* of shoulder flexion/abduction. Generalized weakness at elbow and distally Right Lower Extremity Assessment RLE ROM/Strength/Tone: Deficits RLE ROM/Strength/Tone Deficits: Pt tends to favor R side when ambulating and note that she externally rotates leg at all times, causing her foot to sometimes be  outside of RW when ambulating.  RLE Sensation: WFL - Light Touch Left Lower Extremity Assessment LLE ROM/Strength/Tone: WFL for tasks assessed LLE Sensation: WFL - Light Touch Trunk Assessment Trunk Assessment: Kyphotic   Balance    End of Session PT - End of Session Equipment Utilized During Treatment: Gait belt Activity Tolerance: Patient tolerated treatment well Patient left: in chair;with call bell/phone within reach;with chair alarm set;with family/visitor present Nurse Communication: Mobility status  GP Functional Assessment Tool Used: Clinical judgement.  Functional Limitation: Mobility: Walking and moving around Mobility: Walking and Moving Around Current Status [phone]): At least 20 percent but less than 40 percent impaired, limited or restricted Mobility: Walking and Moving Around Goal Status [phone]): At least 1 percent but less than 20 percent impaired, limited or restricted   Lessie Dings 03/31/2012, 12:39 PM

## 2012-03-31 NOTE — Progress Notes (Signed)
Pharmacy Communication Regarding Oral Chemotherapy:  Patient's Gleevec has been held based on hold criteria below.  Per [employer] policy, only oncology may continue therapy despite hold criteria.  Imatinib (Gleevec) hold criteria  Hgb < 8  ANC < 1  Pltc < 100K  AST or ALT >5x ULN  Bilirubin > 3x ULN  Gastrointestinal perforation  Hemorrhage  -->  subarachnoid hemorrhage with possible minimal subdural blood  New or worsened CHF  Severe fluid overload  Clance Boll, PharmD, BCPS Pager: [phone] 03/31/2012 7:40 AM

## 2012-03-31 NOTE — Progress Notes (Signed)
[facility] PROGRESS NOTE  [name] KGM:[id number] DOB: 06-04-1927 DOA: 03/30/2012 PCP: [name], MD  Assessment/Plan: 1.  Subarachnoid hemorrhage with possible minimal subdural blood  Discussed with Dr. Venetia Maxon, neurosurgery. Given the overall small size of subarachnoid hemorrhage, Dr. Venetia Maxon recommended conservative management. He recommended serial neuro exams every 2 hours. If exam is unchanged, will need no further evaluation. If there is any change in exam, will need further evaluation. Patient per Dr. Venetia Maxon appears to have a followup appointment with one of the neurosurgeons, Dr. Danielle Dess? already arranged from previous admission. Gentle hydration on normal saline to prevent hyponatremia. Patient's neuro exam remains unchanged and she has walked for PT and recommendations is for SNF.   Fall  Likely due to deconditioning. Continue physical therapy. Currently at [facility].  Hypertension  Continue home antihypertensive medications.  GERD  Continue PPI.  Depression/bipolar disorder  Stable. Continue medications.  Chronic myelocytic leukemia  Continue Gleevec (Dr. Venetia Maxon notified that she was on Gleevac). Patient sees Dr. Truett Perna.  Hypothyroidism  Continue Synthroid.  Hypokalemia  Replace as needed.  Mild anemia  Anemia panel ordered.  Prophylaxis  SCDs. No heparin given concern for subarachnoid hemorrhage.     Consultants:  Neuro surgery.   HPI/Subjective:  comfortable. Concerned if insurance is going to pay for the snf.   Objective: Filed Vitals:   03/30/12 2352 03/31/12 0416 03/31/12 1325 03/31/12 1410  BP: 154/60 147/90 133/109 144/91  Pulse: 70 66 69   Temp: 98.9 F (37.2 C) 98.4 F (36.9 C) 98.2 F (36.8 C)   TempSrc: Oral Oral Oral   Resp: 18 18 20    Height: 5\' 8"  (1.727 m)     Weight: 77.973 kg (171 lb 14.4 oz) 78.155 kg (172 lb 4.8 oz)    SpO2: 98% 97% 97%     Intake/Output Summary (Last 24 hours) at 03/31/12 1933 Last data  filed at 03/31/12 1800  Gross per 24 hour  Intake   1790 ml  Output   1225 ml  Net    565 ml   Filed Weights   03/30/12 2352 03/31/12 0416  Weight: 77.973 kg (171 lb 14.4 oz) 78.155 kg (172 lb 4.8 oz)    Exam:   General:  Alert afebrile comfortable  Cardiovascular: s1s2  Respiratory: CTAB  Abdomen:soft NT ND BS+  Extemities; trace pedal edema  Neuro; no focal deficits. No headache.   Data Reviewed: Basic Metabolic Panel:  Lab 03/31/12 6440 03/30/12 2105  NA 138 139  K 3.5 3.3*  CL 105 103  CO2 25 26  GLUCOSE 107* 122*  BUN 13 14  CREATININE 0.65 0.77  CALCIUM 9.3 9.2  MG 1.8 --  PHOS -- --   Liver Function Tests: No results found for this basename: AST:5,ALT:5,ALKPHOS:5,BILITOT:5,PROT:5,ALBUMIN:5 in the last 168 hours No results found for this basename: LIPASE:5,AMYLASE:5 in the last 168 hours No results found for this basename: AMMONIA:5 in the last 168 hours CBC:  Lab 03/31/12 0442 03/30/12 2105  WBC 4.6 5.9  NEUTROABS -- 4.1  HGB 10.9* 11.4*  HCT 33.4* 34.5*  MCV 95.2 95.0  PLT 176 189   Cardiac Enzymes: No results found for this basename: CKTOTAL:5,CKMB:5,CKMBINDEX:5,TROPONINI:5 in the last 168 hours BNP (last 3 results) No results found for this basename: PROBNP:3 in the last 8760 hours CBG: No results found for this basename: GLUCAP:5 in the last 168 hours  No results found for this or any previous visit (from the past 240 hour(s)).   Studies: Ct  Head Wo Contrast  03/30/2012  *RADIOLOGY REPORT*  Clinical Data: Larey Seat, history colon cancer, hypertension, bipolar disorder, CML  CT HEAD WITHOUT CONTRAST  Technique:  Contiguous axial images were obtained from the base of the skull through the vertex without contrast.  Comparison: 03/03/2012  Findings: Generalized atrophy. Stable ventricular morphology. No midline shift or mass effect. Small vessel chronic ischemic changes of deep cerebral white matter. Few scattered foci of subarachnoid blood are  seen at the hemispheres bilaterally. These appear more posterior than the foci of subarachnoid blood which were seen on the previous study. In addition there is questionably a small amount of blood at the left tentorium, question subarachnoid versus minimal subdural blood, new. No intraparenchymal hemorrhage, mass lesion or evidence of acute infarction. Visualized paranasal sinuses and mastoid air cells clear. Extensive atherosclerotic calcifications at skull base. Calvaria appear intact.  IMPRESSION: Atrophy with small vessel chronic ischemic changes of deep cerebral white matter. Few scattered foci of subarachnoid hemorrhage are seen within sulci at hemispheres bilaterally with an additional small amount of blood at the free edge of the left tentorium, question subarachnoid versus minimal subdural blood.  Critical Value/emergent results were called by telephone at the time of interpretation on 03/30/2012 at 1930 hours to Dr. Rubin Payor, who verbally acknowledged these results.   Original Report Authenticated By: Ulyses Southward, M.D.     Scheduled Meds:   . beta carotene w/minerals  1 tablet Oral Daily  . celecoxib  200 mg Oral Daily  . darifenacin  15 mg Oral Daily  . levothyroxine  137 mcg Oral QAC breakfast  . metoprolol succinate  50 mg Oral q morning - 10a  . multivitamin with minerals  1 tablet Oral q morning - 10a  . Oxcarbazepine  300 mg Oral Daily  . Oxcarbazepine  600 mg Oral QHS  . pantoprazole  40 mg Oral Daily  . PARoxetine  30 mg Oral QHS  . [COMPLETED] potassium chloride  40 mEq Oral Once  . [DISCONTINUED] imatinib  300 mg Oral Daily  . [DISCONTINUED] VITAMIN E (TOPICAL)  1 application Apply externally Daily   Continuous Infusions:   . [EXPIRED] sodium chloride      Principal Problem:  *Fall Active Problems:  Unspecified episodic mood disorder  Subarachnoid hemorrhage  Thyroid disease  HTN (hypertension)  GERD (gastroesophageal reflux disease)  Depression  Bipolar 1  disorder  CML (chronic myelocytic leukemia)  History of colon cancer  Anemia      Zacharias Ridling  [facility] Pager [phone]. If 8PM-8AM, please contact night-coverage at [email or web address], password [facility] 03/31/2012, 7:33 PM  LOS: 1 day

## 2012-03-31 NOTE — Progress Notes (Signed)
    CARE MANAGEMENT NOTE 03/31/2012  Patient:  [name], [name]   Account Number:  [id number]  Date Initiated:  03/31/2012  Documentation initiated by:  Jiles Crocker  Subjective/Objective Assessment:   ADMITTED WITH FALL; subarachnoid hemorrhage     Action/Plan:   PCP: [name], MD  PATIENT RESIDES AT [facility]   Anticipated DC Date:  04/02/2012   Anticipated DC Plan:  ASSISTED LIVING / REST HOME  In-house referral  Clinical Social Worker      DC Planning Services  CM consult     Status of service:  In process, will continue to follow Medicare Important Message given?  NA - LOS <3 / Initial given by admissions (If response is "NO", the following Medicare IM given date fields will be blank)  Per UR Regulation:  Reviewed for med. necessity/level of care/duration of stay  Comments:  03/31/2012- B Rolanda Campa RN,BSN,MHA

## 2012-04-01 ENCOUNTER — Observation Stay (HOSPITAL_COMMUNITY): Payer: Medicare Other

## 2012-04-01 DIAGNOSIS — W19XXXA Unspecified fall, initial encounter: Secondary | ICD-10-CM

## 2012-04-01 DIAGNOSIS — I619 Nontraumatic intracerebral hemorrhage, unspecified: Secondary | ICD-10-CM

## 2012-04-01 DIAGNOSIS — R531 Weakness: Secondary | ICD-10-CM

## 2012-04-01 DIAGNOSIS — F319 Bipolar disorder, unspecified: Secondary | ICD-10-CM

## 2012-04-01 DIAGNOSIS — M6281 Muscle weakness (generalized): Secondary | ICD-10-CM

## 2012-04-01 DIAGNOSIS — I609 Nontraumatic subarachnoid hemorrhage, unspecified: Secondary | ICD-10-CM

## 2012-04-01 DIAGNOSIS — C9210 Chronic myeloid leukemia, BCR/ABL-positive, not having achieved remission: Secondary | ICD-10-CM

## 2012-04-01 LAB — FOLATE: Folate: 10 ng/mL (ref >5.4)

## 2012-04-01 LAB — MRSA PCR SCREENING: MRSA by PCR: POSITIVE — AB

## 2012-04-01 LAB — IRON AND TIBC
Iron: 48 ug/dL (ref 42–135)
TIBC: 297 ug/dL (ref 250–470)
UIBC: 249 ug/dL (ref 125–400)

## 2012-04-01 LAB — FERRITIN: Ferritin: 36 ng/mL (ref 10–291)

## 2012-04-01 MED ORDER — BIOTENE DRY MOUTH MT LIQD
15.0000 mL | Freq: Two times a day (BID) | OROMUCOSAL | Status: DC
  Administered 2012-04-01 – 2012-04-07 (×12): 15 mL via OROMUCOSAL

## 2012-04-01 MED ORDER — WHITE PETROLATUM GEL
Status: AC
  Administered 2012-04-01: 0.2
  Filled 2012-04-01: qty 5

## 2012-04-01 MED ORDER — CHLORHEXIDINE GLUCONATE CLOTH 2 % EX PADS: 6.0000 | MEDICATED_PAD | Freq: Every day | CUTANEOUS | Status: DC

## 2012-04-01 MED ORDER — MUPIROCIN 2 % EX OINT
1.0000 "application " | TOPICAL_OINTMENT | Freq: Two times a day (BID) | CUTANEOUS | Status: AC
  Administered 2012-04-01 – 2012-04-05 (×9): 1 via NASAL
  Filled 2012-04-01: qty 22

## 2012-04-01 MED ORDER — HYDRALAZINE HCL 20 MG/ML IJ SOLN
10.0000 mg | Freq: Once | INTRAMUSCULAR | Status: AC
  Administered 2012-04-01: 10 mg via INTRAVENOUS
  Filled 2012-04-01: qty 1

## 2012-04-01 MED ORDER — MUPIROCIN 2 % EX OINT: 1.0000 "application " | TOPICAL_OINTMENT | Freq: Two times a day (BID) | CUTANEOUS | Status: DC

## 2012-04-01 MED ORDER — HYDRALAZINE HCL 20 MG/ML IJ SOLN
2.0000 mg | Freq: Three times a day (TID) | INTRAMUSCULAR | Status: DC | PRN
  Administered 2012-04-03: 2 mg via INTRAVENOUS
  Filled 2012-04-01: qty 1
  Filled 2012-04-01: qty 0.1

## 2012-04-01 MED ORDER — CHLORHEXIDINE GLUCONATE CLOTH 2 % EX PADS
6.0000 | MEDICATED_PAD | Freq: Every day | CUTANEOUS | Status: AC
  Administered 2012-04-03 – 2012-04-06 (×4): 6 via TOPICAL

## 2012-04-01 MED ORDER — METOPROLOL TARTRATE 1 MG/ML IV SOLN
2.5000 mg | Freq: Three times a day (TID) | INTRAVENOUS | Status: DC
  Administered 2012-04-01 – 2012-04-03 (×5): 2.5 mg via INTRAVENOUS
  Filled 2012-04-01 (×8): qty 5

## 2012-04-01 MED ORDER — LORAZEPAM 2 MG/ML IJ SOLN: 0.5000 mg | Freq: Once | INTRAMUSCULAR | Status: DC

## 2012-04-01 MED ORDER — LEVOTHYROXINE SODIUM 100 MCG IV SOLR
67.5000 ug | Freq: Every day | INTRAVENOUS | Status: DC
  Administered 2012-04-01 – 2012-04-03 (×3): 67.5 ug via INTRAVENOUS
  Filled 2012-04-01 (×5): qty 3.4

## 2012-04-01 MED ORDER — KCL IN DEXTROSE-NACL 20-5-0.9 MEQ/L-%-% IV SOLN
INTRAVENOUS | Status: DC
  Administered 2012-04-01 – 2012-04-03 (×3): via INTRAVENOUS
  Filled 2012-04-01 (×4): qty 1000

## 2012-04-01 MED ORDER — FENTANYL CITRATE 0.05 MG/ML IJ SOLN
12.5000 ug | INTRAMUSCULAR | Status: DC | PRN
  Administered 2012-04-01: 25 ug via INTRAVENOUS
  Administered 2012-04-01: 12.5 ug via INTRAVENOUS
  Administered 2012-04-01 – 2012-04-02 (×4): 25 ug via INTRAVENOUS
  Administered 2012-04-02: 12.5 ug via INTRAVENOUS
  Administered 2012-04-04: 25 ug via INTRAVENOUS
  Filled 2012-04-01 (×8): qty 2

## 2012-04-01 MED ORDER — SODIUM CHLORIDE 0.9 % IV SOLN
INTRAVENOUS | Status: DC
  Administered 2012-04-01: 1000 mL via INTRAVENOUS

## 2012-04-01 MED ORDER — PANTOPRAZOLE SODIUM 40 MG IV SOLR
40.0000 mg | INTRAVENOUS | Status: DC
  Administered 2012-04-01 – 2012-04-02 (×2): 40 mg via INTRAVENOUS
  Filled 2012-04-01 (×3): qty 40

## 2012-04-01 MED ORDER — LORAZEPAM 2 MG/ML IJ SOLN
0.5000 mg | INTRAMUSCULAR | Status: DC | PRN
  Administered 2012-04-01 – 2012-04-07 (×12): 0.5 mg via INTRAVENOUS
  Filled 2012-04-01 (×12): qty 1

## 2012-04-01 NOTE — Progress Notes (Signed)
Pt transferred to [address] NP, Dr. Blake Divine were notifed of room number and results of CT, which were called to the unit by Dr. Caryl Pina.Marland Kitchen

## 2012-04-01 NOTE — Progress Notes (Signed)
Event: RN notified that pt found this am incontinent of urine which is unusual for her. Upon further assessment pt noted unable to move LUE or LLE. This is a new finding since last neuro check at the beginning of the shift. Tech taht initially found pt stated pt was c/o "pai"n to (L) wrist area and (L) thigh. Last seen normal at approx 2300 last evening. Has slept quietly since that time. NP to bedside. Subjective: Pt c/o spasm type pain to (L) wrist. Denies CP or other symptoms. Objective: 78 y/o elderly female admitted 03/30/12 s/p fall. Mx falls recently. Recent dx of SDH after a fall. Pt noted lying in bed. Somewhat anxious but otherwise appropriate and able to follow simple commands. Skin w/d, color normal. EOMI. No obvious facial droop. Pt unable to move LUE or LLE. BBS CTA, HR-74 w/ RRR. Remaining current VSS, T-98.5, BP-154/108, R-20 w/ 02 sats of 97% on R/A.  CBC and chemistry panel w/ in last 24 hrs remarkable for anemia (H&H 10.9/33.4 from 12.6/37.1 on admission but otherwise normal). Stat Ct head w/o cm pending. Assessment/Plan: 1. Left sided weakness/paralysis: (New) Neuro exam was normal last night. Last seen normal at approx 2300 last evening. No other obvious deficits. VSS. Concern for worsening SDH vs acute stroke. Ct head pending. Will make pt NPO for now and continue to monitor closely.  Leanne Chang, NP-C [facility] Pager [phone]

## 2012-04-01 NOTE — Consult Note (Signed)
NEURO HOSPITALIST CONSULT NOTE    Reason for Consult: SAH  HPI:                                                                                                                                          [name] is an 78 y.o. female Who was recently admitted to [facility] hospital after a traumatic fall which resulted in subarachnoid hemorrhage with small amount of blood at the free edge of the left tentorium, question subarachnoid versus minimal subdural blood. Given small size of SAH neurosurgery felt conservative treatment was best. The following day patient was noted to be incontinent, LUE and LE flaccidity. Patient was sent back to CT scanner to evaluate for further intracranial bleed. Second CT scan revealed 6.8 X 4.0 in the right parietal lobe with no midline shift. Patient was then transferred to [facility] neuro ICU and neurology was asked to consult to help with further management. PAtient has been made a DNR status as of this AM.   At present time patient is alert, oriented , able to follow my commands.  She does show left sided neglect, left arm and leg paralysis.    Past Medical History  Diagnosis Date  . Thyroid disease   . HTN (hypertension)   . GERD (gastroesophageal reflux disease)   . Depression   . Bipolar 1 disorder   . Cancer     colon ca  . CML (chronic myelocytic leukemia)     Past Surgical History  Procedure Date  . Colon surgery   . Abdominal hysterectomy   . Cholecystectomy   . Total hip arthroplasty     bilateral    Family History  Problem Relation Age of Onset  . Depression Mother     Social History:  reports that she quit smoking about 53 years ago. She has never used smokeless tobacco. She reports that she does not drink alcohol or use illicit drugs.  Allergies  Allergen Reactions  . Stelazine (Trifluoperazine)     "I wanted to climb the walls."    MEDICATIONS:                                                                                                                      Prior to  Admission:  Prescriptions prior to admission  Medication Sig Dispense Refill  . acetaminophen (TYLENOL) 500 MG tablet Take 1 tablet (500 mg total) by mouth every 6 (six) hours as needed. pain  30 tablet  1  . beta carotene w/minerals (OCUVITE) tablet Take 1 tablet by mouth daily.      . celecoxib (CELEBREX) 200 MG capsule Take 200 mg by mouth daily.      . clonazePAM (KLONOPIN) 0.5 MG tablet Take 1/2 to 1 tab severe anxiety  30 tablet  0  . darifenacin (ENABLEX) 15 MG 24 hr tablet Take 15 mg by mouth daily.      Marland Kitchen imatinib (GLEEVEC) 100 MG tablet Take 300 mg by mouth daily. Take with meals and large glass of water.Caution:Chemotherapy      . levothyroxine (SYNTHROID, LEVOTHROID) 137 MCG tablet Take 1 tablet (137 mcg total) by mouth daily.  30 tablet  1  . metoprolol succinate (TOPROL-XL) 50 MG 24 hr tablet Take 1 tablet (50 mg total) by mouth every morning.  30 tablet  1  . Multiple Vitamin (MULTIVITAMIN WITH MINERALS) TABS Take 1 tablet by mouth every morning.       Marland Kitchen omeprazole (PRILOSEC) 20 MG capsule Take 1 capsule (20 mg total) by mouth daily as needed. For heartburn  30 capsule  0  . Oxcarbazepine (TRILEPTAL) 300 MG tablet Take 1-2 tablets (300-600 mg total) by mouth 2 (two) times daily. Take 1 tablet by mouth in the morning and 2 tablets by mouth at bedtime.  270 tablet  0  . PARoxetine (PAXIL) 30 MG tablet Take 1 tablet (30 mg total) by mouth at bedtime.  90 tablet  0  . VITAMIN E, TOPICAL, OIL Apply 1 application topically daily. Apply to left big toe      . nitroGLYCERIN (NITROSTAT) 0.3 MG SL tablet Place 1 tablet (0.3 mg total) under the tongue every 5 (five) minutes as needed. Chest pain  90 tablet  0   Scheduled:   . beta carotene w/minerals  1 tablet Oral Daily  . celecoxib  200 mg Oral Daily  . darifenacin  15 mg Oral Daily  . levothyroxine  137 mcg Oral QAC breakfast  . LORazepam  0.5 mg Intravenous Once  .  metoprolol succinate  50 mg Oral q morning - 10a  . multivitamin with minerals  1 tablet Oral q morning - 10a  . Oxcarbazepine  300 mg Oral Daily  . Oxcarbazepine  600 mg Oral QHS  . pantoprazole  40 mg Oral Daily  . PARoxetine  30 mg Oral QHS     ROS:                                                                                                                                       History obtained from the patient  General ROS: negative for - chills, fatigue, fever, night sweats, weight gain  or weight loss Psychological ROS: negative for - behavioral disorder, hallucinations, memory difficulties, mood swings or suicidal ideation Ophthalmic ROS: negative for - blurry vision, double vision, eye pain or loss of vision ENT ROS: negative for - epistaxis, nasal discharge, oral lesions, sore throat, tinnitus or vertigo Allergy and Immunology ROS: negative for - hives or itchy/watery eyes Hematological and Lymphatic ROS: negative for - bleeding problems, bruising or swollen lymph nodes Endocrine ROS: negative for - galactorrhea, hair pattern changes, polydipsia/polyuria or temperature intolerance Respiratory ROS: negative for - cough, hemoptysis, shortness of breath or wheezing Cardiovascular ROS: negative for - chest pain, dyspnea on exertion, edema or irregular heartbeat Gastrointestinal ROS: negative for - abdominal pain, diarrhea, hematemesis, nausea/vomiting or stool incontinence Genito-Urinary ROS: negative for - dysuria, hematuria, incontinence or urinary frequency/urgency Musculoskeletal ROS: negative for - joint swelling or muscular weakness Neurological ROS: as noted in HPI Dermatological ROS: negative for rash and skin lesion changes   Blood pressure 149/78, pulse 86, temperature 98.5 F (36.9 C), temperature source Oral, resp. rate 17, height 5\' 8"  (1.727 m), weight 77.4 kg (170 lb 10.2 oz), SpO2 96.00%.   Neurologic Examination:                                                                                                       Mental Status: Alert, oriented, thought content appropriate.  Speech with slight dysarthria without evidence of aphasia.  Able to follow 3 step commands without difficulty. Cranial Nerves: II: Discs flat bilaterally; Visual fields grossly normal, pupils equal, round, reactive to light and accommodation III,IV, VI: ptosis not present, extra-ocular motions intact bilaterally V,VII: smile symmetric at rest has slight left NL fold decrease, facial light touch sensation normal bilaterally VIII: hearing normal bilaterally IX,X: gag reflex present JX:[id number] shoulder shrug on the left.  XII: midline tongue extension Motor: Right : Upper extremity   4+/5    Left:     Upper extremity   0/5  Lower extremity   4+/5     Lower extremity   1/5 (able to slightly abduct at hip) Tone and bulk:normal tone throughout; no atrophy noted Sensory: Pinprick and light touch decreased on left arm and leg.  Neglect left arm and leg to DSS.  Deep Tendon Reflexes: 2+ and symmetric throughout with minimal AJ Plantars: Right: downgoing   Left: upgoing Cerebellar: normal finger-to-nose,  normal heel-to-shin test normal on right-unable to perform on left.  CV: pulses palpable throughout     No results found for this basename: cbc, bmp, coags, chol, tri, ldl, hga1c    Results for orders placed during the hospital encounter of 03/30/12 (from the past 48 hour(s))  CBC WITH DIFFERENTIAL     Status: Abnormal   Collection Time   03/30/12  9:05 PM      Component Value Range Comment   WBC 5.9  4.0 - 10.5 K/uL    RBC 3.63 (*) 3.87 - 5.11 MIL/uL    Hemoglobin 11.4 (*) 12.0 - 15.0 g/dL    HCT 21.3 (*) 08.6 - 46.0 %    MCV  95.0  78.0 - 100.0 fL    MCH 31.4  26.0 - 34.0 pg    MCHC 33.0  30.0 - 36.0 g/dL    RDW 16.1  09.6 - 04.5 %    Platelets 189  150 - 400 K/uL    Neutrophils Relative 69  43 - 77 %    Neutro Abs 4.1  1.7 - 7.7 K/uL    Lymphocytes Relative 20  12 -  46 %    Lymphs Abs 1.2  0.7 - 4.0 K/uL    Monocytes Relative 9  3 - 12 %    Monocytes Absolute 0.6  0.1 - 1.0 K/uL    Eosinophils Relative 1  0 - 5 %    Eosinophils Absolute 0.1  0.0 - 0.7 K/uL    Basophils Relative 1  0 - 1 %    Basophils Absolute 0.0  0.0 - 0.1 K/uL   BASIC METABOLIC PANEL     Status: Abnormal   Collection Time   03/30/12  9:05 PM      Component Value Range Comment   Sodium 139  135 - 145 mEq/L    Potassium 3.3 (*) 3.5 - 5.1 mEq/L    Chloride 103  96 - 112 mEq/L    CO2 26  19 - 32 mEq/L    Glucose, Bld 122 (*) 70 - 99 mg/dL    BUN 14  6 - 23 mg/dL    Creatinine, Ser 4.09  0.50 - 1.10 mg/dL    Calcium 9.2  8.4 - 81.1 mg/dL    GFR calc non Af Amer 76 (*) >90 mL/min    GFR calc Af Amer 88 (*) >90 mL/min   PROTIME-INR     Status: Normal   Collection Time   03/30/12  9:05 PM      Component Value Range Comment   Prothrombin Time 13.8  11.6 - 15.2 seconds    INR 1.07  0.00 - 1.49   APTT     Status: Normal   Collection Time   03/30/12  9:05 PM      Component Value Range Comment   aPTT 29  24 - 37 seconds   BASIC METABOLIC PANEL     Status: Abnormal   Collection Time   03/31/12  4:42 AM      Component Value Range Comment   Sodium 138  135 - 145 mEq/L    Potassium 3.5  3.5 - 5.1 mEq/L    Chloride 105  96 - 112 mEq/L    CO2 25  19 - 32 mEq/L    Glucose, Bld 107 (*) 70 - 99 mg/dL    BUN 13  6 - 23 mg/dL    Creatinine, Ser 9.14  0.50 - 1.10 mg/dL    Calcium 9.3  8.4 - 78.2 mg/dL    GFR calc non Af Amer 80 (*) >90 mL/min    GFR calc Af Amer >90  >90 mL/min   CBC     Status: Abnormal   Collection Time   03/31/12  4:42 AM      Component Value Range Comment   WBC 4.6  4.0 - 10.5 K/uL    RBC 3.51 (*) 3.87 - 5.11 MIL/uL    Hemoglobin 10.9 (*) 12.0 - 15.0 g/dL    HCT 95.6 (*) 21.3 - 46.0 %    MCV 95.2  78.0 - 100.0 fL    MCH 31.1  26.0 - 34.0 pg    MCHC  32.6  30.0 - 36.0 g/dL    RDW 95.6  21.3 - 08.6 %    Platelets 176  150 - 400 K/uL   MAGNESIUM     Status:  Normal   Collection Time   03/31/12  4:42 AM      Component Value Range Comment   Magnesium 1.8  1.5 - 2.5 mg/dL   VITAMIN V78     Status: Normal   Collection Time   03/31/12  4:42 AM      Component Value Range Comment   Vitamin B-12 360  211 - 911 pg/mL   IRON AND TIBC     Status: Normal   Collection Time   03/31/12  4:42 AM      Component Value Range Comment   Iron 85  42 - 135 ug/dL    TIBC 469  629 - 528 ug/dL    Saturation Ratios 30  20 - 55 %    UIBC 194  125 - 400 ug/dL   FERRITIN     Status: Normal   Collection Time   03/31/12  4:42 AM      Component Value Range Comment   Ferritin 29  10 - 291 ng/mL   RETICULOCYTES     Status: Abnormal   Collection Time   03/31/12  4:42 AM      Component Value Range Comment   Retic Ct Pct 1.1  0.4 - 3.1 %    RBC. 3.51 (*) 3.87 - 5.11 MIL/uL    Retic Count, Manual 38.6  19.0 - 186.0 K/uL   RETICULOCYTES     Status: Abnormal   Collection Time   04/01/12  4:59 AM      Component Value Range Comment   Retic Ct Pct 1.4  0.4 - 3.1 %    RBC. 3.76 (*) 3.87 - 5.11 MIL/uL    Retic Count, Manual 52.6  19.0 - 186.0 K/uL     Ct Head Wo Contrast  04/01/2012  *RADIOLOGY REPORT*  Clinical Data: Contracted left side.  CT HEAD WITHOUT CONTRAST  Technique:  Contiguous axial images were obtained from the base of the skull through the vertex without contrast.  Comparison: 03/30/2012  Findings: Interval intraparenchymal hemorrhage within the right frontal parietal lobe, measuring 6.8 x 4.0 cm.  Surrounding edema. Local sulcal effacement.  No midline shift.  Small areas of subarachnoid hemorrhage again noted bilaterally. Periventricular and subcortical white matter hypodensities are most in keeping with chronic microangiopathic change.  No hydrocephalous The visualized paranasal sinuses and mastoid air cells are predominately clear.  IMPRESSION: Interval 6.8 x 4.0 cm intraparenchymal hemorrhage within the right frontal parietal lobe. Local sulcal effacement  however no midline shift. Underlying lesion not excluded.  Subarachnoid hemorrhage again noted.  Critical Value/emergent results were called by telephone at the time of interpretation on 04/01/2012 at 07:05 a.m. to Kevan Ny, who verbally acknowledged these results.   Original Report Authenticated By: Jearld Lesch, M.D.    Ct Head Wo Contrast  03/30/2012  *RADIOLOGY REPORT*  Clinical Data: Larey Seat, history colon cancer, hypertension, bipolar disorder, CML  CT HEAD WITHOUT CONTRAST  Technique:  Contiguous axial images were obtained from the base of the skull through the vertex without contrast.  Comparison: 03/03/2012  Findings: Generalized atrophy. Stable ventricular morphology. No midline shift or mass effect. Small vessel chronic ischemic changes of deep cerebral white matter. Few scattered foci of subarachnoid blood are seen at the hemispheres bilaterally. These appear more posterior than the  foci of subarachnoid blood which were seen on the previous study. In addition there is questionably a small amount of blood at the left tentorium, question subarachnoid versus minimal subdural blood, new. No intraparenchymal hemorrhage, mass lesion or evidence of acute infarction. Visualized paranasal sinuses and mastoid air cells clear. Extensive atherosclerotic calcifications at skull base. Calvaria appear intact.  IMPRESSION: Atrophy with small vessel chronic ischemic changes of deep cerebral white matter. Few scattered foci of subarachnoid hemorrhage are seen within sulci at hemispheres bilaterally with an additional small amount of blood at the free edge of the left tentorium, question subarachnoid versus minimal subdural blood.  Critical Value/emergent results were called by telephone at the time of interpretation on 03/30/2012 at 1930 hours to Dr. Rubin Payor, who verbally acknowledged these results.   Original Report Authenticated By: Ulyses Southward, M.D.      Assessment/Plan:  78 YO female with initial  traumatic SAH now with right 6.8 x 4.0 cm intraparenchymal hemorrhage within the frontal parietal lobe.  Patietns BP had been well controled until 04/01/12 at which point BP became elevated to 191/107.  Since admission BP has been slightly elevated and currently being treated to obtain SBP <160.  Given location etiology likely secondary to HTN.   Recommend: 1) BP management.  Keep SBP <160 2) Hold antiplatelet therapy 3) Continue to monitor for and treat hyperglycemia and fever as needed.  4) PT/OT/SLP 5) Would not recommend initiating AED at this time.   Felicie Morn PA-C [facility] [phone]  Patient was examined by me personally and I agree with the above assessment. Treatment recommendations above were read by me as well  Venetia Maxon M.D. [facility] [phone].  04/01/2012, 8:57 AM

## 2012-04-01 NOTE — Consult Note (Signed)
PULMONARY  / CRITICAL CARE MEDICINE  Name: [name] MRN: [id number] DOB: 1928-02-21    LOS: 2  REFERRING MD : Kathlen Mody  CHIEF COMPLAINT:  Fall  BRIEF PATIENT DESCRIPTION:  78 yo female former smoker from rehab admitted 03/30/2012 with recent fall with subsequent SAH.  Was in rehab, and had recurrent fall with development of ICH.  Transferred to ICU 11/19 after and PCCM asked to assume medical management per [facility] protocol.  Pt is DNR.  Significant PMHx HTN, Hypothyroidism, GERD, Depression, Bipolar, Colon Cancer, CML   LINES / TUBES: None  CULTURES: None  ANTIBIOTICS: None  SIGNIFICANT EVENTS:  11/19 Transfer to ICU  LEVEL OF CARE:  ICU PRIMARY SERVICE:  PCCM CONSULTANTS:  Neurosurgery Venetia Maxon), Neurology CODE STATUS: DNR DIET: NPO DVT Px:  SCD GI Px:  Protonix  Past Surgical History  Procedure Date  . Colon surgery   . Abdominal hysterectomy   . Cholecystectomy   . Total hip arthroplasty     bilateral   Prior to Admission medications   Medication Sig Start Date End Date Taking? Authorizing Provider  acetaminophen (TYLENOL) 500 MG tablet Take 1 tablet (500 mg total) by mouth every 6 (six) hours as needed. pain 03/04/12  Yes Dorothea Ogle, MD  beta carotene w/minerals (OCUVITE) tablet Take 1 tablet by mouth daily.   Yes Historical Provider, MD  celecoxib (CELEBREX) 200 MG capsule Take 200 mg by mouth daily.   Yes Historical Provider, MD  clonazePAM (KLONOPIN) 0.5 MG tablet Take 1/2 to 1 tab severe anxiety 03/27/12  Yes Cleotis Nipper, MD  darifenacin (ENABLEX) 15 MG 24 hr tablet Take 15 mg by mouth daily.   Yes Historical Provider, MD  imatinib (GLEEVEC) 100 MG tablet Take 300 mg by mouth daily. Take with meals and large glass of water.Caution:Chemotherapy   Yes Historical Provider, MD  levothyroxine (SYNTHROID, LEVOTHROID) 137 MCG tablet Take 1 tablet (137 mcg total) by mouth daily. 03/04/12  Yes Dorothea Ogle, MD  metoprolol succinate (TOPROL-XL) 50  MG 24 hr tablet Take 1 tablet (50 mg total) by mouth every morning. 03/04/12  Yes Dorothea Ogle, MD  Multiple Vitamin (MULTIVITAMIN WITH MINERALS) TABS Take 1 tablet by mouth every morning.    Yes Historical Provider, MD  omeprazole (PRILOSEC) 20 MG capsule Take 1 capsule (20 mg total) by mouth daily as needed. For heartburn 03/04/12  Yes Dorothea Ogle, MD  Oxcarbazepine (TRILEPTAL) 300 MG tablet Take 1-2 tablets (300-600 mg total) by mouth 2 (two) times daily. Take 1 tablet by mouth in the morning and 2 tablets by mouth at bedtime. 03/27/12  Yes Cleotis Nipper, MD  PARoxetine (PAXIL) 30 MG tablet Take 1 tablet (30 mg total) by mouth at bedtime. 03/27/12  Yes Cleotis Nipper, MD  VITAMIN E, TOPICAL, OIL Apply 1 application topically daily. Apply to left big toe   Yes Historical Provider, MD  nitroGLYCERIN (NITROSTAT) 0.3 MG SL tablet Place 1 tablet (0.3 mg total) under the tongue every 5 (five) minutes as needed. Chest pain 03/04/12   Dorothea Ogle, MD   Allergies  Allergen Reactions  . Stelazine (Trifluoperazine)     "I wanted to climb the walls."    FAMILY HISTORY:  Family History  Problem Relation Age of Onset  . Depression Mother    SOCIAL HISTORY:  reports that she quit smoking about 53 years ago. She has never used smokeless tobacco. She reports that she does not drink alcohol or  use illicit drugs.  REVIEW OF SYSTEMS:   C/o headache.  Has intermittent tremor of right leg, but she can control this.  Denies chest pain/abdominal pain, dyspnea.   INTERVAL HISTORY:   VITAL SIGNS: Temp:  [97.9 F (36.6 C)-98.9 F (37.2 C)] 98.9 F (37.2 C) (11/19 1010) Pulse Rate:  [28-104] 75  (11/19 1010) Resp:  [16-23] 18  (11/19 1010) BP: (120-191)/(52-129) 175/74 mmHg (11/19 1010) SpO2:  [85 %-100 %] 96 % (11/19 1010) Weight:  [170 lb 10.2 oz (77.4 kg)-172 lb 2.9 oz (78.1 kg)] 170 lb 10.2 oz (77.4 kg) (11/19 0700)    INTAKE / OUTPUT: Intake/Output      11/18 0701 - 11/19 0700 11/19 0701 -  11/20 0700   P.O. 480    I.V. (mL/kg) 550 (7.1) 35.3 (0.5)   Total Intake(mL/kg) 1030 (13.3) 35.3 (0.5)   Urine (mL/kg/hr) 900 (0.5) 560   Total Output 900 560   Net +130 -524.7        Urine Occurrence 2 x    Stool Occurrence 3 x      PHYSICAL EXAMINATION: General:  No distress Neuro:  Follows commands, alert, resting tremor Rt leg stops on command, weak Lt upper arm HEENT:  No sinus tenderness, no oral exudate, no LAN Cardiovascular:  s1s2 regular, no murmur Lungs:  No wheeze/rales Abdomen:  Soft, non-tender, + bowel sounds Musculoskeletal:  No edema Skin:  No rashes   LABS:  Lab 03/31/12 0442 03/30/12 2105  HGB 10.9* 11.4*  WBC 4.6 5.9  PLT 176 189  NA 138 139  K 3.5 3.3*  CL 105 103  CO2 25 26  GLUCOSE 107* 122*  BUN 13 14  CREATININE 0.65 0.77  CALCIUM 9.3 9.2  MG 1.8 --  PHOS -- --  AST -- --  ALT -- --  ALKPHOS -- --  BILITOT -- --  PROT -- --  ALBUMIN -- --  APTT -- 29  INR -- 1.07  LATICACIDVEN -- --  TROPONINI -- --  PROCALCITON -- --  PROBNP -- --  O2SATVEN -- --  PHART -- --  PCO2ART -- --  PO2ART -- --   No results found for this basename: GLUCAP:5 in the last 168 hours  IMAGING:  DIAGNOSES: Principal Problem:  *Fall Active Problems:  Unspecified episodic mood disorder  Subarachnoid hemorrhage  Thyroid disease  HTN (hypertension)  GERD (gastroesophageal reflux disease)  Depression  Bipolar 1 disorder  CML (chronic myelocytic leukemia)  History of colon cancer  Anemia  Left-sided weakness   ASSESSMENT / PLAN:  PULMONARY  ASSESSMENT: No acute issues. PLAN:   Oxygen as needed to keep SpO2 > 92%  CARDIOVASCULAR  ASSESSMENT:  Hx of HTN with HTN crisis contributing to ICH. PLAN:  Change lopressor to IV Hydralazine to keep SBP < 160 per neurology  RENAL  ASSESSMENT:   Hypokalemia. PLAN:   Monitor renal fx, urine outpt, electrolytes Add KCL to IV fluids  GASTROINTESTINAL  ASSESSMENT:   Dysphagia. PLAN:     Speech therapy to assess swallowing NPO for now Add dextrose to IV fluids  HEMATOLOGIC  ASSESSMENT:   Anemia. Hx of CML. PLAN:  F/u CBC  INFECTIOUS  ASSESSMENT:   No acute issues. PLAN:   Monitor clinically  ENDOCRINE  ASSESSMENT:  Hypothyroidism.   PLAN:   Change synthroid to IV until swallow assessed further   NEUROLOGIC  ASSESSMENT:   SAH, ICH. Hx of Depression, bipolar. PLAN:   Per neurology, neurosurgery Ativan prn for  anxiety  CLINICAL SUMMARY:  Keep on PCCM service while in ICU.  Pt DNR/DNI.  Coralyn Helling, MD [facility] Pulmonary/Critical Care 04/01/2012, 11:05 AM Pager:  [phone] After 3pm call: [phone]

## 2012-04-01 NOTE — Progress Notes (Addendum)
[facility] PROGRESS NOTE  [name] ZOX:[id number] DOB: Feb 23, 1928 DOA: 03/30/2012 PCP: [name], MD  Assessment/Plan:                                          Subarachnoid hemorrhage with rebleeding: She was initially admitted for fall and an initial CT demonstrated a small subarachnoid hemorrhage. She was admitted to hospitalist service and Neuro surgery called. Dr Venetia Maxon recommended observation and frequent neuro checks ie every 2 hours for 36 hours. Early 11/19 patient was found to be incontinent with left sided weakness, as per the patient she woke up incontinent , unable to move her left side. RR was called and a stat CT head ordered, Repeat CT showed an interval intraparenchymal hemorrhage within the right frontal parietal lobe of 6.8 4 cm. Dr Venetia Maxon was called , recommended transfer to [facility] for observation for 2 days, and no surgical intervention needed at this time. Spoke to the [employer], reported that his mom's wishes were no heroic measures and DNR. Patient appears oriented to place, person and time.   Requested Neurology consult to assist [place] with recommendations for BP control and ICH.  Fall  Likely due to deconditioning. Continue physical therapy. Currently at [facility]. PT recommended SNF.  Hypertension  PRN hydralazine for BP > 180 TO 200, OR MAP> 130 MMHG. GERD  Continue PPI.  Depression/bipolar disorder  Stable. Continue medications.  Chronic myelocytic leukemia  Continue Gleevec (Dr. Venetia Maxon notified that she was on Gleevac). Patient sees Dr. Truett Perna.  Hypothyroidism  Continue Synthroid.  Hypokalemia  Replace as needed.  Mild anemia  Anemia panel ordered.  Prophylaxis  SCDs. No heparin given concern for subarachnoid hemorrhage.     Consultants:  Neuro surgery.   HPI/Subjective:  comfortable. Speech clear but anxious.   Objective: Filed Vitals:   04/01/12 0610 04/01/12 0620 04/01/12 0700 04/01/12 0715  BP: 191/107 172/106  173/82 151/129  Pulse: 94 104 85 79  Temp:      TempSrc:      Resp: 20 22 20 18   Height:   5\' 8"  (1.727 m)   Weight:   77.4 kg (170 lb 10.2 oz)   SpO2: 97% 97% 98% 97%    Intake/Output Summary (Last 24 hours) at 04/01/12 0832 Last data filed at 03/31/12 2242  Gross per 24 hour  Intake   1030 ml  Output    900 ml  Net    130 ml   Filed Weights   03/31/12 0416 04/01/12 0519 04/01/12 0700  Weight: 78.155 kg (172 lb 4.8 oz) 78.1 kg (172 lb 2.9 oz) 77.4 kg (170 lb 10.2 oz)    Exam:   General:  Alert afebrile comfortable  Cardiovascular: s1s2  Respiratory: CTAB  Abdomen:soft NT ND BS+  Extemities; trace pedal edema  Neuro: LUE LLE WEAKNESS unable to move. Pt is oriented to place , person and time. No sensory deficits.   Data Reviewed: Basic Metabolic Panel:  Lab 03/31/12 8119 03/30/12 2105  NA 138 139  K 3.5 3.3*  CL 105 103  CO2 25 26  GLUCOSE 107* 122*  BUN 13 14  CREATININE 0.65 0.77  CALCIUM 9.3 9.2  MG 1.8 --  PHOS -- --   Liver Function Tests: No results found for this basename: AST:5,ALT:5,ALKPHOS:5,BILITOT:5,PROT:5,ALBUMIN:5 in the last 168 hours No results found for this basename: LIPASE:5,AMYLASE:5 in the last 168  hours No results found for this basename: AMMONIA:5 in the last 168 hours CBC:  Lab 03/31/12 0442 03/30/12 2105  WBC 4.6 5.9  NEUTROABS -- 4.1  HGB 10.9* 11.4*  HCT 33.4* 34.5*  MCV 95.2 95.0  PLT 176 189   Cardiac Enzymes: No results found for this basename: CKTOTAL:5,CKMB:5,CKMBINDEX:5,TROPONINI:5 in the last 168 hours BNP (last 3 results) No results found for this basename: PROBNP:3 in the last 8760 hours CBG: No results found for this basename: GLUCAP:5 in the last 168 hours  No results found for this or any previous visit (from the past 240 hour(s)).   Studies: Ct Head Wo Contrast  04/01/2012  *RADIOLOGY REPORT*  Clinical Data: Contracted left side.  CT HEAD WITHOUT CONTRAST  Technique:  Contiguous axial images were  obtained from the base of the skull through the vertex without contrast.  Comparison: 03/30/2012  Findings: Interval intraparenchymal hemorrhage within the right frontal parietal lobe, measuring 6.8 x 4.0 cm.  Surrounding edema. Local sulcal effacement.  No midline shift.  Small areas of subarachnoid hemorrhage again noted bilaterally. Periventricular and subcortical white matter hypodensities are most in keeping with chronic microangiopathic change.  No hydrocephalous The visualized paranasal sinuses and mastoid air cells are predominately clear.  IMPRESSION: Interval 6.8 x 4.0 cm intraparenchymal hemorrhage within the right frontal parietal lobe. Local sulcal effacement however no midline shift. Underlying lesion not excluded.  Subarachnoid hemorrhage again noted.  Critical Value/emergent results were called by telephone at the time of interpretation on 04/01/2012 at 07:05 a.m. to Kevan Ny, who verbally acknowledged these results.   Original Report Authenticated By: Jearld Lesch, M.D.    Ct Head Wo Contrast  03/30/2012  *RADIOLOGY REPORT*  Clinical Data: Larey Seat, history colon cancer, hypertension, bipolar disorder, CML  CT HEAD WITHOUT CONTRAST  Technique:  Contiguous axial images were obtained from the base of the skull through the vertex without contrast.  Comparison: 03/03/2012  Findings: Generalized atrophy. Stable ventricular morphology. No midline shift or mass effect. Small vessel chronic ischemic changes of deep cerebral white matter. Few scattered foci of subarachnoid blood are seen at the hemispheres bilaterally. These appear more posterior than the foci of subarachnoid blood which were seen on the previous study. In addition there is questionably a small amount of blood at the left tentorium, question subarachnoid versus minimal subdural blood, new. No intraparenchymal hemorrhage, mass lesion or evidence of acute infarction. Visualized paranasal sinuses and mastoid air cells clear. Extensive  atherosclerotic calcifications at skull base. Calvaria appear intact.  IMPRESSION: Atrophy with small vessel chronic ischemic changes of deep cerebral white matter. Few scattered foci of subarachnoid hemorrhage are seen within sulci at hemispheres bilaterally with an additional small amount of blood at the free edge of the left tentorium, question subarachnoid versus minimal subdural blood.  Critical Value/emergent results were called by telephone at the time of interpretation on 03/30/2012 at 1930 hours to Dr. Rubin Payor, who verbally acknowledged these results.   Original Report Authenticated By: Ulyses Southward, M.D.     Scheduled Meds:    . beta carotene w/minerals  1 tablet Oral Daily  . celecoxib  200 mg Oral Daily  . darifenacin  15 mg Oral Daily  . levothyroxine  137 mcg Oral QAC breakfast  . LORazepam  0.5 mg Intravenous Once  . metoprolol succinate  50 mg Oral q morning - 10a  . multivitamin with minerals  1 tablet Oral q morning - 10a  . Oxcarbazepine  300 mg Oral Daily  .  Oxcarbazepine  600 mg Oral QHS  . pantoprazole  40 mg Oral Daily  . PARoxetine  30 mg Oral QHS   Continuous Infusions:    . [EXPIRED] sodium chloride    . sodium chloride 1,000 mL (04/01/12 0814)    Principal Problem:  *Fall Active Problems:  Unspecified episodic mood disorder  Subarachnoid hemorrhage  Thyroid disease  HTN (hypertension)  GERD (gastroesophageal reflux disease)  Depression  Bipolar 1 disorder  CML (chronic myelocytic leukemia)  History of colon cancer  Anemia  Left-sided weakness      [name]  [facility] Pager [phone]. If 8PM-8AM, please contact night-coverage at [email or web address], password [facility] 04/01/2012, 8:32 AM  LOS: 2 days

## 2012-04-01 NOTE — Progress Notes (Signed)
Pt woke up in urine with weakness and left sided paralysis of upper and lower extremities. BP was 154/108 and HR 73. Baseline blood pressure has been 120s-140s systolic and HR 60-70 throughout the night. Neuro check performed. Pupils looked fine, but left side paralysis of extremities and generalized weakness. Speech was not garbled, within baseline. MD paged and ICU nurse visited. MD ordered STAT CT scan of the head. Pt transferred with ICU nurse to CT. Will wait for results. - Christell Faith, RN

## 2012-04-01 NOTE — Progress Notes (Signed)
For transfer to 3100 report called awaiting [facility] transfer.  Neuro's unchanged.  B/P better 149/78 (95), SR with rare PVC's. Caesar Chestnut RN

## 2012-04-01 NOTE — Consult Note (Signed)
Reason for Consult: SAH  HPI:  [name] is an 78 y.o. female Who was recently admitted to [facility] hospital after a traumatic fall which resulted in subarachnoid hemorrhage with small amount of blood at the free edge of the left tentorium, question subarachnoid versus minimal subdural blood. Given small size of SAH neurosurgery felt conservative treatment was best. The following day patient was noted to be incontinent, LUE and LE flaccidity. Patient was sent back to CT scanner to evaluate for further intracranial bleed. Second CT scan revealed 6.8 X 4.0 in the right parietal lobe with no midline shift. Patient was then transferred to [facility] neuro ICU and neurolosurgery was asked to consult to help with further management. PAtient has been made a DNR status as of this AM.  At present time patient is alert, oriented , able to follow my commands. She does show left sided neglect, left arm and leg paralysis.  Past Medical History   Diagnosis  Date   .  Thyroid disease    .  HTN (hypertension)    .  GERD (gastroesophageal reflux disease)    .  Depression    .  Bipolar 1 disorder    .  Cancer      colon ca   .  CML (chronic myelocytic leukemia)     Past Surgical History   Procedure  Date   .  Colon surgery    .  Abdominal hysterectomy    .  Cholecystectomy    .  Total hip arthroplasty      bilateral    Family History   Problem  Relation  Age of Onset   .  Depression  Mother     Social History: reports that she quit smoking about 53 years ago. She has never used smokeless tobacco. She reports that she does not drink alcohol or use illicit drugs.  Allergies   Allergen  Reactions   .  Stelazine (Trifluoperazine)      "I wanted to climb the walls."    MEDICATIONS:  Prior to Admission:  Prescriptions prior to admission   Medication  Sig  Dispense  Refill   .  acetaminophen (TYLENOL) 500 MG tablet  Take 1 tablet (500 mg total) by mouth every 6 (six) hours as needed. pain  30 tablet  1     .  beta carotene w/minerals (OCUVITE) tablet  Take 1 tablet by mouth daily.     .  celecoxib (CELEBREX) 200 MG capsule  Take 200 mg by mouth daily.     .  clonazePAM (KLONOPIN) 0.5 MG tablet  Take 1/2 to 1 tab severe anxiety  30 tablet  0   .  darifenacin (ENABLEX) 15 MG 24 hr tablet  Take 15 mg by mouth daily.     Marland Kitchen  imatinib (GLEEVEC) 100 MG tablet  Take 300 mg by mouth daily. Take with meals and large glass of water.Caution:Chemotherapy     .  levothyroxine (SYNTHROID, LEVOTHROID) 137 MCG tablet  Take 1 tablet (137 mcg total) by mouth daily.  30 tablet  1   .  metoprolol succinate (TOPROL-XL) 50 MG 24 hr tablet  Take 1 tablet (50 mg total) by mouth every morning.  30 tablet  1   .  Multiple Vitamin (MULTIVITAMIN WITH MINERALS) TABS  Take 1 tablet by mouth every morning.     Marland Kitchen  omeprazole (PRILOSEC) 20 MG capsule  Take 1 capsule (20 mg total) by mouth daily as needed.  For heartburn  30 capsule  0   .  Oxcarbazepine (TRILEPTAL) 300 MG tablet  Take 1-2 tablets (300-600 mg total) by mouth 2 (two) times daily. Take 1 tablet by mouth in the morning and 2 tablets by mouth at bedtime.  270 tablet  0   .  PARoxetine (PAXIL) 30 MG tablet  Take 1 tablet (30 mg total) by mouth at bedtime.  90 tablet  0   .  VITAMIN E, TOPICAL, OIL  Apply 1 application topically daily. Apply to left big toe     .  nitroGLYCERIN (NITROSTAT) 0.3 MG SL tablet  Place 1 tablet (0.3 mg total) under the tongue every 5 (five) minutes as needed. Chest pain  90 tablet  0   Scheduled:  .  beta carotene w/minerals  1 tablet  Oral  Daily   .  celecoxib  200 mg  Oral  Daily   .  darifenacin  15 mg  Oral  Daily   .  levothyroxine  137 mcg  Oral  QAC breakfast   .  LORazepam  0.5 mg  Intravenous  Once   .  metoprolol succinate  50 mg  Oral  q morning - 10a   .  multivitamin with minerals  1 tablet  Oral  q morning - 10a   .  Oxcarbazepine  300 mg  Oral  Daily   .  Oxcarbazepine  600 mg  Oral  QHS   .  pantoprazole  40 mg  Oral  Daily    .  PARoxetine  30 mg  Oral  QHS   ROS:  History obtained from the patient  General ROS: negative for - chills, fatigue, fever, night sweats, weight gain or weight loss  Psychological ROS: negative for - behavioral disorder, hallucinations, memory difficulties, mood swings or suicidal ideation  Ophthalmic ROS: negative for - blurry vision, double vision, eye pain or loss of vision  ENT ROS: negative for - epistaxis, nasal discharge, oral lesions, sore throat, tinnitus or vertigo  Allergy and Immunology ROS: negative for - hives or itchy/watery eyes  Hematological and Lymphatic ROS: negative for - bleeding problems, bruising or swollen lymph nodes  Endocrine ROS: negative for - galactorrhea, hair pattern changes, polydipsia/polyuria or temperature intolerance  Respiratory ROS: negative for - cough, hemoptysis, shortness of breath or wheezing  Cardiovascular ROS: negative for - chest pain, dyspnea on exertion, edema or irregular heartbeat  Gastrointestinal ROS: negative for - abdominal pain, diarrhea, hematemesis, nausea/vomiting or stool incontinence  Genito-Urinary ROS: negative for - dysuria, hematuria, incontinence or urinary frequency/urgency  Musculoskeletal ROS: negative for - joint swelling or muscular weakness  Neurological ROS: as noted in HPI  Dermatological ROS: negative for rash and skin lesion changes  Blood pressure 149/78, pulse 86, temperature 98.5 F (36.9 C), temperature source Oral, resp. rate 17, height 5\' 8"  (1.727 m), weight 77.4 kg (170 lb 10.2 oz), SpO2 96.00%.  Neurologic Examination:  Mental Status:  Alert, oriented, thought content appropriate. Speech with slight dysarthria without evidence of aphasia. Able to follow 3 step commands without difficulty.  Cranial Nerves:  II: Discs flat bilaterally; Visual fields grossly normal, pupils equal, round, reactive to light and accommodation  III,IV, VI: ptosis not present, extra-ocular motions intact bilaterally, except  would not look to left. V,VII: smile symmetric at rest has slight left NL fold decrease, facial light touch sensation normal bilaterally  VIII: hearing normal bilaterally  IX,X: gag reflex present  [id number] shoulder shrug on the  left.  XII: midline tongue extension  Motor:  Right : Upper extremity 5/5 Left: Upper extremity 0/5  Lower extremity 4+/5 Lower extremity 1/5 (able to slightly abduct at hip)  Tone and bulk:normal tone throughout; no atrophy noted  Sensory: Pinprick and light touch decreased on left arm and leg. Neglect left arm and leg to DSS.  Deep Tendon Reflexes: 2+ and symmetric throughout with minimal AJ  Plantars:  Right: downgoing Left: upgoing  Cerebellar:  normal finger-to-nose, normal heel-to-shin test normal on right-unable to perform on left.  CV: pulses palpable throughout  No results found for this basename: cbc, bmp, coags, chol, tri, ldl, hga1c    Results for orders placed during the hospital encounter of 03/30/12 (from the past 48 hour(s))   CBC WITH DIFFERENTIAL Status: Abnormal    Collection Time    03/30/12 9:05 PM   Component  Value  Range  Comment    WBC  5.9  4.0 - 10.5 K/uL     RBC  3.63 (*)  3.87 - 5.11 MIL/uL     Hemoglobin  11.4 (*)  12.0 - 15.0 g/dL     HCT  40.9 (*)  81.1 - 46.0 %     MCV  95.0  78.0 - 100.0 fL     MCH  31.4  26.0 - 34.0 pg     MCHC  33.0  30.0 - 36.0 g/dL     RDW  91.4  78.2 - 95.6 %     Platelets  189  150 - 400 K/uL     Neutrophils Relative  69  43 - 77 %     Neutro Abs  4.1  1.7 - 7.7 K/uL     Lymphocytes Relative  20  12 - 46 %     Lymphs Abs  1.2  0.7 - 4.0 K/uL     Monocytes Relative  9  3 - 12 %     Monocytes Absolute  0.6  0.1 - 1.0 K/uL     Eosinophils Relative  1  0 - 5 %     Eosinophils Absolute  0.1  0.0 - 0.7 K/uL     Basophils Relative  1  0 - 1 %     Basophils Absolute  0.0  0.0 - 0.1 K/uL    BASIC METABOLIC PANEL Status: Abnormal    Collection Time    03/30/12 9:05 PM   Component  Value  Range   Comment    Sodium  139  135 - 145 mEq/L     Potassium  3.3 (*)  3.5 - 5.1 mEq/L     Chloride  103  96 - 112 mEq/L     CO2  26  19 - 32 mEq/L     Glucose, Bld  122 (*)  70 - 99 mg/dL     BUN  14  6 - 23 mg/dL     Creatinine, Ser  2.13  0.50 - 1.10 mg/dL     Calcium  9.2  8.4 - 10.5 mg/dL     GFR calc non Af Amer  76 (*)  >90 mL/min     GFR calc Af Amer  88 (*)  >90 mL/min    PROTIME-INR Status: Normal    Collection Time    03/30/12 9:05 PM   Component  Value  Range  Comment    Prothrombin Time  13.8  11.6 - 15.2 seconds     INR  1.07  0.00 -  1.49    APTT Status: Normal    Collection Time    03/30/12 9:05 PM   Component  Value  Range  Comment    aPTT  29  24 - 37 seconds    BASIC METABOLIC PANEL Status: Abnormal    Collection Time    03/31/12 4:42 AM   Component  Value  Range  Comment    Sodium  138  135 - 145 mEq/L     Potassium  3.5  3.5 - 5.1 mEq/L     Chloride  105  96 - 112 mEq/L     CO2  25  19 - 32 mEq/L     Glucose, Bld  107 (*)  70 - 99 mg/dL     BUN  13  6 - 23 mg/dL     Creatinine, Ser  1.19  0.50 - 1.10 mg/dL     Calcium  9.3  8.4 - 10.5 mg/dL     GFR calc non Af Amer  80 (*)  >90 mL/min     GFR calc Af Amer  >90  >90 mL/min    CBC Status: Abnormal    Collection Time    03/31/12 4:42 AM   Component  Value  Range  Comment    WBC  4.6  4.0 - 10.5 K/uL     RBC  3.51 (*)  3.87 - 5.11 MIL/uL     Hemoglobin  10.9 (*)  12.0 - 15.0 g/dL     HCT  14.7 (*)  82.9 - 46.0 %     MCV  95.2  78.0 - 100.0 fL     MCH  31.1  26.0 - 34.0 pg     MCHC  32.6  30.0 - 36.0 g/dL     RDW  56.2  13.0 - 86.5 %     Platelets  176  150 - 400 K/uL    MAGNESIUM Status: Normal    Collection Time    03/31/12 4:42 AM   Component  Value  Range  Comment    Magnesium  1.8  1.5 - 2.5 mg/dL    VITAMIN H84 Status: Normal    Collection Time    03/31/12 4:42 AM   Component  Value  Range  Comment    Vitamin B-12  360  211 - 911 pg/mL    IRON AND TIBC Status: Normal    Collection Time     03/31/12 4:42 AM   Component  Value  Range  Comment    Iron  85  42 - 135 ug/dL     TIBC  696  295 - 284 ug/dL     Saturation Ratios  30  20 - 55 %     UIBC  194  125 - 400 ug/dL    FERRITIN Status: Normal    Collection Time    03/31/12 4:42 AM   Component  Value  Range  Comment    Ferritin  29  10 - 291 ng/mL    RETICULOCYTES Status: Abnormal    Collection Time    03/31/12 4:42 AM   Component  Value  Range  Comment    Retic Ct Pct  1.1  0.4 - 3.1 %     RBC.  3.51 (*)  3.87 - 5.11 MIL/uL     Retic Count, Manual  38.6  19.0 - 186.0 K/uL    RETICULOCYTES Status: Abnormal    Collection Time    04/01/12 4:59  AM   Component  Value  Range  Comment    Retic Ct Pct  1.4  0.4 - 3.1 %     RBC.  3.76 (*)  3.87 - 5.11 MIL/uL     Retic Count, Manual  52.6  19.0 - 186.0 K/uL     Ct Head Wo Contrast  04/01/2012 *RADIOLOGY REPORT* Clinical Data: Contracted left side. CT HEAD WITHOUT CONTRAST Technique: Contiguous axial images were obtained from the base of the skull through the vertex without contrast. Comparison: 03/30/2012 Findings: Interval intraparenchymal hemorrhage within the right frontal parietal lobe, measuring 6.8 x 4.0 cm. Surrounding edema. Local sulcal effacement. No midline shift. Small areas of subarachnoid hemorrhage again noted bilaterally. Periventricular and subcortical white matter hypodensities are most in keeping with chronic microangiopathic change. No hydrocephalous The visualized paranasal sinuses and mastoid air cells are predominately clear. IMPRESSION: Interval 6.8 x 4.0 cm intraparenchymal hemorrhage within the right frontal parietal lobe. Local sulcal effacement however no midline shift. Underlying lesion not excluded. Subarachnoid hemorrhage again noted. Critical Value/emergent results were called by telephone at the time of interpretation on 04/01/2012 at 07:05 a.m. to Kevan Ny, who verbally acknowledged these results. Original Report Authenticated By: Jearld Lesch,  M.D.  Ct Head Wo Contrast  03/30/2012 *RADIOLOGY REPORT* Clinical Data: Larey Seat, history colon cancer, hypertension, bipolar disorder, CML CT HEAD WITHOUT CONTRAST Technique: Contiguous axial images were obtained from the base of the skull through the vertex without contrast. Comparison: 03/03/2012 Findings: Generalized atrophy. Stable ventricular morphology. No midline shift or mass effect. Small vessel chronic ischemic changes of deep cerebral white matter. Few scattered foci of subarachnoid blood are seen at the hemispheres bilaterally. These appear more posterior than the foci of subarachnoid blood which were seen on the previous study. In addition there is questionably a small amount of blood at the left tentorium, question subarachnoid versus minimal subdural blood, new. No intraparenchymal hemorrhage, mass lesion or evidence of acute infarction. Visualized paranasal sinuses and mastoid air cells clear. Extensive atherosclerotic calcifications at skull base. Calvaria appear intact. IMPRESSION: Atrophy with small vessel chronic ischemic changes of deep cerebral white matter. Few scattered foci of subarachnoid hemorrhage are seen within sulci at hemispheres bilaterally with an additional small amount of blood at the free edge of the left tentorium, question subarachnoid versus minimal subdural blood. Critical Value/emergent results were called by telephone at the time of interpretation on 03/30/2012 at 1930 hours to Dr. Rubin Payor, who verbally acknowledged these results. Original Report Authenticated By: Ulyses Southward, M.D.  Assessment/Plan:  78 YO female with initial traumatic SAH now with right 6.8 x 4.0 cm intraparenchymal hemorrhage within the frontal parietal lobe. Patietns BP had been well controled until 04/01/12 at which point BP became elevated to 191/107. Since admission BP has been slightly elevated and currently being treated to obtain SBP <160. Given location etiology likely secondary to HTN. No role  for surgery at this time, regardless of family wishes, but on discussion with family, they wish for medical management and comfort care without intubation or surgery.  Will check repeat Head CT in am to assess for interval enlargement of ICH. Recommend:  1) BP management. Keep SBP <160  2) Hold antiplatelet therapy  3) Continue to monitor for and treat hyperglycemia and fever as needed.  4) PT/OT/SLP  5) Would not recommend initiating AED at this time.   Danae Orleans. Venetia Maxon, MD Neurosurgery

## 2012-04-01 NOTE — Clinical Social Work Note (Signed)
CSW was to assess this am as Pt is from [facility] and  PT note recommends SNF placement due to falls. Pt in process of transfer to 3100. No family present, but they are aware of transport per RN. [facility] ICU CSW will inform [facility] CSW to later assess and follow for SNF.   Doreen Salvage, LCSW ICU/Stepdown Clinical Social Worker [facility] [phone] Hours 8am-1200pm M-F

## 2012-04-01 NOTE — Significant Event (Signed)
Rapid Response Event Note  Overview: Time Called: 0650 Arrival Time: 0655 Event Type: Neurologic  Initial Focused Assessment: Called to see patient with possible code stroke. Pt woke incontinent of urine and was found to have no movement or very limited movement of left side. Left arm and had drawing and painful.Pt was very anxious and tearful. The drawing sensation was generalized to the legs and left arm. She had no grip on the left and just a flicker of movement left arm and leg. She was seen last at 2300 11/18 awake and MAE.   Interventions: Taken for CT of the head and transferred to 1229   Event Summary: Name of Physician Notified: Vernona Rieger at [phone]    at    Outcome: Transferred (Comment) (1229 after ct of head)  Event End Time: 0715  Bayard Hugger

## 2012-04-01 NOTE — Evaluation (Signed)
Clinical/Bedside Swallow Evaluation Patient Details  Name: [name] MRN: [id number] Date of Birth: 1927-11-13  Today's Date: 04/01/2012 Time: 1478-2956 SLP Time Calculation (min): 18 min  Past Medical History:  Past Medical History  Diagnosis Date  . Thyroid disease   . HTN (hypertension)   . GERD (gastroesophageal reflux disease)   . Depression   . Bipolar 1 disorder   . Cancer     colon ca  . CML (chronic myelocytic leukemia)    Past Surgical History:  Past Surgical History  Procedure Date  . Colon surgery   . Abdominal hysterectomy   . Cholecystectomy   . Total hip arthroplasty     bilateral   HPI:  78 YO female with initial traumatic SAH now with right 6.8 x 4.0 cm intraparenchymal hemorrhage within the frontal parietal lobe.    Assessment / Plan / Recommendation Clinical Impression  Exam limited by lethargy. Patient aroused easily for oral motor exam and health history however unable to maintain adequate levels of alertness for po trials. SLP will plan to f/u in am 11/20 for diagnostic po trials at bedside to determine potential to advance diet.  (Educated son; risk of aspiration, rationale for holding po)    Aspiration Risk  Moderate    Diet Recommendation NPO   Medication Administration: Via alternative means    Other  Recommendations Oral Care Recommendations: Oral care QID   Follow Up Recommendations  Other (comment) (TBD)    Frequency and Duration min 3x week  2 weeks   Pertinent Vitals/Pain n/a    SLP Swallow Goals Goal #3: Patient will maintain aroused state without clinician cues for diagnostic po trials at bedside.  Swallow Study Goal #3 - Progress: Not met   Swallow Study    General HPI: 78 YO female with initial traumatic SAH now with right 6.8 x 4.0 cm intraparenchymal hemorrhage within the frontal parietal lobe.  Type of Study: Bedside swallow evaluation Previous Swallow Assessment: Noted esophagram complete in 01/2004 indicated  silent aspiration of thin liquid barium. F/u MBS noted however no report located.  (Son unaware of any h/o dysphagia, diet changes, or PNA) Diet Prior to this Study: NPO Temperature Spikes Noted: No Respiratory Status: Room air History of Recent Intubation: No Behavior/Cognition: Lethargic;Confused Oral Cavity - Dentition: Dentures, top (missing bottom dentures) Patient Positioning: Upright in bed Baseline Vocal Quality: Clear Volitional Cough: Strong Volitional Swallow: Able to elicit    Oral/Motor/Sensory Function Overall Oral Motor/Sensory Function: Impaired Labial ROM: Reduced left Labial Symmetry: Abnormal symmetry left Labial Strength: Reduced Labial Sensation:  (NT) Lingual ROM: Reduced left Lingual Symmetry: Within Functional Limits Lingual Strength:  (unable to fully assess due to AMS) Lingual Sensation:  (NT) Facial ROM: Reduced left Facial Symmetry: Left droop Velum: Within Functional Limits Mandible: Within Functional Limits   Ice Chips Ice chips: Not tested   Thin Liquid Thin Liquid: Not tested    Nectar Thick Nectar Thick Liquid: Not tested   Honey Thick Honey Thick Liquid: Not tested   Puree Puree: Not tested   Solid   GO   Yeiden Frenkel MA, CCC-SLP [phone]  Solid: Not tested       Genee Rann Meryl 04/01/2012,1:44 PM

## 2012-04-02 ENCOUNTER — Inpatient Hospital Stay (HOSPITAL_COMMUNITY): Payer: Medicare Other

## 2012-04-02 ENCOUNTER — Encounter (HOSPITAL_COMMUNITY): Payer: Self-pay | Admitting: Radiology

## 2012-04-02 DIAGNOSIS — I619 Nontraumatic intracerebral hemorrhage, unspecified: Secondary | ICD-10-CM

## 2012-04-02 LAB — BASIC METABOLIC PANEL
Calcium: 8.7 mg/dL (ref 8.4–10.5)
GFR calc non Af Amer: 83 mL/min — ABNORMAL LOW (ref >90)
Potassium: 3.2 mEq/L — ABNORMAL LOW (ref 3.5–5.1)
Sodium: 142 mEq/L (ref 135–145)

## 2012-04-02 LAB — CBC
MCH: 31.6 pg (ref 26.0–34.0)
MCHC: 33.4 g/dL (ref 30.0–36.0)
Platelets: 155 10*3/uL (ref 150–400)
RBC: 3.7 MIL/uL — ABNORMAL LOW (ref 3.87–5.11)

## 2012-04-02 MED ORDER — POTASSIUM CHLORIDE CRYS ER 20 MEQ PO TBCR
40.0000 meq | EXTENDED_RELEASE_TABLET | Freq: Once | ORAL | Status: AC
  Administered 2012-04-02: 40 meq via ORAL
  Filled 2012-04-02 (×2): qty 1

## 2012-04-02 NOTE — Consult Note (Signed)
PULMONARY  / CRITICAL CARE MEDICINE  Name: [name] MRN: [id number] DOB: 04-04-1928    LOS: 3  REFERRING MD : Kathlen Mody  CHIEF COMPLAINT:  Fall  BRIEF PATIENT DESCRIPTION:  78 yo female former smoker from rehab admitted 03/30/2012 with recent fall with subsequent SAH.  Was in rehab, and had recurrent fall with development of ICH.  Transferred to ICU 11/19 after and PCCM asked to assume medical management per [facility] protocol.  Pt is DNR.  Significant PMHx HTN, Hypothyroidism, GERD, Depression, Bipolar, Colon Cancer, CML   LINES / TUBES: None  CULTURES: None  ANTIBIOTICS: None  SIGNIFICANT EVENTS:  11/19 Transfer to ICU 11/20 Repeat Head Ct scan >> slight increase in R ICH, slight increase in shift (5mm)  LEVEL OF CARE:  ICU PRIMARY SERVICE:  PCCM CONSULTANTS:  Neurosurgery Venetia Maxon), Neurology CODE STATUS: DNR DIET: NPO DVT Px:  SCD GI Px:  Protonix   INTERVAL HISTORY:  Up in chair, no clinical change CT scan with slight increase in ICH and shift 11/20 as above  VITAL SIGNS: Temp:  [98.1 F (36.7 C)-98.8 F (37.1 C)] 98.6 F (37 C) (11/20 0800) Pulse Rate:  [61-114] 82  (11/20 1000) Resp:  [17-24] 22  (11/20 1000) BP: (108-188)/(41-143) 157/78 mmHg (11/20 1000) SpO2:  [90 %-100 %] 96 % (11/20 1000)    INTAKE / OUTPUT: Intake/Output      11/19 0701 - 11/20 0700 11/20 0701 - 11/21 0700   P.O.     I.V. (mL/kg) 977.8 (12.6) 200 (2.6)   IV Piggyback 10    Total Intake(mL/kg) 987.8 (12.8) 200 (2.6)   Urine (mL/kg/hr) 1610 (0.9) 95 (0.3)   Total Output 1610 95   Net -622.2 +105          PHYSICAL EXAMINATION: General:  No distress, up in chair Neuro:  Follows commands, alert, resting tremor Rt leg stops on command, weak Lt upper arm HEENT:  No sinus tenderness, no oral exudate, no LAN Cardiovascular:  s1s2 regular, no murmur Lungs:  No wheeze/rales Abdomen:  Soft, non-tender, + bowel sounds Musculoskeletal:  No edema Skin:  No  rashes   LABS:  Lab 04/02/12 0440 03/31/12 0442 03/30/12 2105  HGB 11.7* 10.9* 11.4*  WBC 6.7 4.6 5.9  PLT 155 176 189  NA 142 138 139  K 3.2* 3.5 --  CL 109 105 103  CO2 26 25 26   GLUCOSE 136* 107* 122*  BUN 8 13 14   CREATININE 0.59 0.65 0.77  CALCIUM 8.7 9.3 9.2  MG -- 1.8 --  PHOS -- -- --  AST -- -- --  ALT -- -- --  ALKPHOS -- -- --  BILITOT -- -- --  PROT -- -- --  ALBUMIN -- -- --  APTT -- -- 29  INR -- -- 1.07  LATICACIDVEN -- -- --  TROPONINI -- -- --  PROCALCITON -- -- --  PROBNP -- -- --  O2SATVEN -- -- --  PHART -- -- --  PCO2ART -- -- --  PO2ART -- -- --   No results found for this basename: GLUCAP:5 in the last 168 hours  IMAGING: 11/20 Head CT scan Ct Head Wo Contrast  04/02/2012  *RADIOLOGY REPORT*  Clinical Data: Follow-up intracranial hemorrhage. History of traumatic fall.  No acute changes per the patient's nurse.  CT HEAD WITHOUT CONTRAST  Technique:  Contiguous axial images were obtained from the base of the skull through the vertex without contrast.  Comparison: 04/01/2012  most recent.  Also CT head 03/30/2012.  Findings: Large right frontoparietal intracerebral hemorrhage with surrounding edema is redemonstrated.  Hemorrhage may be minimally larger measuring 76 x 43 mm on today's exam.  As measured at the septum pellucidum, there is 5 mm right-to-left shift. Slight subarachnoid blood at the vertex and in the left parietal region. Slight peritentorial extension on the left also slightly into the interhemispheric fissure.  No uncal herniation.     Local sulcal effacement persists. Mild contusion left frontal cortex.  Moderate atrophy with chronic white matter changes are redemonstrated. No acute sinus or mastoid disease.  IMPRESSION: Slight increase size right frontoparietal dilated intracerebral hemorrhage.  Slight 5 mm right-to-left shift is mildly increased. Slight left peritentorial extension and subarachnoid blood, stable.   Original Report  Authenticated By: Davonna Belling, M.D.     DIAGNOSES: Principal Problem:  *Fall Active Problems:  Unspecified episodic mood disorder  Subarachnoid hemorrhage  Thyroid disease  HTN (hypertension)  GERD (gastroesophageal reflux disease)  Depression  Bipolar 1 disorder  CML (chronic myelocytic leukemia)  History of colon cancer  Anemia  Left-sided weakness  ICH (intracerebral hemorrhage)   ASSESSMENT / PLAN:  PULMONARY  ASSESSMENT: No acute issues. PLAN:   Oxygen as needed to keep SpO2 > 92%  CARDIOVASCULAR  ASSESSMENT:  Hx of HTN with HTN crisis contributing to ICH. PLAN:  Lopressor to IV Hydralazine to keep SBP < 160 per neurology  RENAL  ASSESSMENT:   Hypokalemia. PLAN:   Monitor renal fx, urine outpt, electrolytes Added KCL to IV fluids  GASTROINTESTINAL  ASSESSMENT:   Dysphagia. PLAN:   Speech therapy to assess swallowing NPO for now Added dextrose to IV fluids  HEMATOLOGIC  ASSESSMENT:   Anemia. Hx of CML. PLAN:  F/u CBC  INFECTIOUS  ASSESSMENT:   No acute issues. PLAN:   Monitor clinically  ENDOCRINE  ASSESSMENT:  Hypothyroidism.   PLAN:   Change synthroid to IV until swallow assessed further   NEUROLOGIC  ASSESSMENT:   SAH, ICH. Hx of Depression, bipolar. PLAN:   No role for sgy at this time per Dr Venetia Maxon; unclear to me whether pt or her son would want her to undergo a sgy even if she decompensated > will need to clarify OK to go out of ICU per neurology 11/20 Ativan prn for anxiety  CLINICAL SUMMARY:  Transfer out of ICU and back to [facility] 11/20.  Pt DNR/DNI.  Levy Pupa, MD, PhD 04/02/2012, 11:09 AM [facility] [phone] or if no answer [phone]

## 2012-04-02 NOTE — Progress Notes (Signed)
Stroke Team Progress Note  HISTORY  [name] is an 78 y.o. female Who was recently admitted to [facility] hospital after a traumatic fall which resulted in subarachnoid hemorrhage with small amount of blood at the free edge of the left tentorium, question subarachnoid versus minimal subdural blood. Given small size of SAH neurosurgery felt conservative treatment was best. The following day patient was noted to be incontinent, LUE and LE flaccidity. Patient was sent back to CT scanner to evaluate for further intracranial bleed. Second CT scan revealed 6.8 X 4.0 in the right parietal lobe with no midline shift. Patient was then transferred to [facility] neuro ICU and neurology was asked to consult to help with further management. Due to decline patient is now DNR   SUBJECTIVE  Patient awake in room. Sitting in chair, states that left side not moving well.   OBJECTIVE Most recent Vital Signs: Filed Vitals:   04/02/12 0100 04/02/12 0200 04/02/12 0300 04/02/12 0400  BP: 134/52 143/58 127/57 113/47  Pulse: 69 71 72 65  Temp:    98.1 F (36.7 C)  TempSrc:    Oral  Resp: 19 19 20 19   Height:      Weight:      SpO2: 96% 98% 94% 95%   IV Fluid Intake:     . dextrose 5 % and 0.9 % NaCl with KCl 20 mEq/L 50 mL/hr at 04/02/12 0500  . [DISCONTINUED] sodium chloride Stopped (04/01/12 1155)    MEDICATIONS    . antiseptic oral rinse  15 mL Mouth Rinse BID  . beta carotene w/minerals  1 tablet Oral Daily  . Chlorhexidine Gluconate Cloth  6 each Topical Q0600  . darifenacin  15 mg Oral Daily  . [COMPLETED] hydrALAZINE  10 mg Intravenous Once  . levothyroxine  67.5 mcg Intravenous QAC breakfast  . metoprolol  2.5 mg Intravenous Q8H  . multivitamin with minerals  1 tablet Oral q morning - 10a  . mupirocin ointment  1 application Nasal BID  . Oxcarbazepine  300 mg Oral Daily  . Oxcarbazepine  600 mg Oral QHS  . pantoprazole (PROTONIX) IV  40 mg Intravenous Q24H  . PARoxetine  30 mg Oral QHS  .  [COMPLETED] white petrolatum      . [DISCONTINUED] celecoxib  200 mg Oral Daily  . [DISCONTINUED] Chlorhexidine Gluconate Cloth  6 each Topical Q0600  . [DISCONTINUED] levothyroxine  137 mcg Oral QAC breakfast  . [DISCONTINUED] LORazepam  0.5 mg Intravenous Once  . [DISCONTINUED] metoprolol succinate  50 mg Oral q morning - 10a  . [DISCONTINUED] mupirocin ointment  1 application Nasal BID  . [DISCONTINUED] pantoprazole  40 mg Oral Daily   PRN:  acetaminophen, fentaNYL, hydrALAZINE, LORazepam, nitroGLYCERIN, ondansetron (ZOFRAN) IV, [DISCONTINUED] acetaminophen, [DISCONTINUED] ondansetron  Diet:  NPO  Activity: Up with assistance DVT Prophylaxis:  SCD  CLINICALLY SIGNIFICANT STUDIES Basic Metabolic Panel:  Lab 04/02/12 1610 03/31/12 0442  NA 142 138  K 3.2* 3.5  CL 109 105  CO2 26 25  GLUCOSE 136* 107*  BUN 8 13  CREATININE 0.59 0.65  CALCIUM 8.7 9.3  MG -- 1.8  PHOS -- --   Liver Function Tests: No results found for this basename: AST:2,ALT:2,ALKPHOS:2,BILITOT:2,PROT:2,ALBUMIN:2 in the last 168 hours CBC:  Lab 04/02/12 0440 03/31/12 0442 03/30/12 2105  WBC 6.7 4.6 --  NEUTROABS -- -- 4.1  HGB 11.7* 10.9* --  HCT 35.0* 33.4* --  MCV 94.6 95.2 --  PLT 155 176 --  Coagulation:  Lab 03/30/12 2105  LABPROT 13.8  INR 1.07    Urine Drug Screen:     Component Value Date/Time   LABOPIA POSITIVE* 11/30/2010 1130   COCAINSCRNUR NONE DETECTED 11/30/2010 1130   LABBENZ NONE DETECTED 11/30/2010 1130   AMPHETMU NONE DETECTED 11/30/2010 1130   THCU NONE DETECTED 11/30/2010 1130   LABBARB NONE DETECTED 11/30/2010 1130   Ct Head Wo Contrast 04/01/2012   Interval 6.8 x 4.0 cm intraparenchymal hemorrhage within the right frontal parietal lobe. Local sulcal effacement however no midline shift. Underlying lesion not excluded.  Subarachnoid hemorrhage again noted.   03/30/2012  Few scattered foci of subarachnoid hemorrhage are seen within sulci at hemispheres bilaterally with an additional  small amount of blood at the free edge of the left tentorium, question subarachnoid versus minimal subdural blood 04/02/2012--pending  CXR  No active disease  EKG  normal EKG, normal sinus rhythm, unchanged from previous tracings, normal sinus rhythm, LBBB.   Therapy Recommendations PT - ; OT - ; ST - SNF ( pt too lethargic to do adequate swallow study)  Physical Exam   Mental Status:  Alert, oriented, thought content appropriate. Speech with slight dysarthria without evidence of aphasia. Able to follow 3 step commands without difficulty.  Cranial Nerves:  II: Discs flat bilaterally; Visual fields grossly normal, pupils equal, round, reactive to light and accommodation  III,IV, VI: ptosis not present, extra-ocular motions intact bilaterally  V,VII: smile symmetric at rest has slight left NL fold decrease, facial light touch sensation normal bilaterally  VIII: hearing normal bilaterally  IX,X: gag reflex present  FA:[id number] shoulder shrug on the left.  XII: midline tongue extension  Motor:  Right : Upper extremity 4+/5 Left: Upper extremity 0/5  Lower extremity 4+/5 Lower extremity 1/5 (able to slightly abduct at hip)  Tone and bulk:normal tone throughout; no atrophy noted  Sensory: Pinprick and light touch decreased on left arm and leg. Neglect left arm and leg to DSS.  Deep Tendon Reflexes: 2+ and symmetric throughout with minimal AJ  Plantars:  Right: downgoing Left: upgoing  Cerebellar:  normal finger-to-nose, normal heel-to-shin test normal on right-unable to perform on left.  CV: pulses palpable throughout     ASSESSMENT Ms. [name] is a 78 y.o. female presenting with traumatic fall. Imaging confirms subarachnoid hemorrhage, medical management at this time. On no anticoagulants prior to admission. Now on no anticoagulants for secondary stroke prevention. Patient with resultant left sided paralysis from new right brain hemorrhage after admission etiology of which is  not clear- hypertensive versus amyloid or hemorrrhagic infarct   Hypothyroidism  Hypertension  Gastroesophageal reflux disease  Depression  Hypokalemia  Bipolar disease 1  Chronic myelocystic leukemia  Hospital day # 3  TREATMENT/PLAN  Continue current support for hemorrhage keeping SBP < 160  No antiplatelet therapy  Neurosurgery following  Therapy following, await swallow eval  Ok to transfer out of unit  Guy Franco, [facility],  MBA, [facility] Pager: [phone] 04/02/2012 7:00 AM  Scribe for Dr. Delia Heady, Stroke Center Medical Director. He has personally reviewed chart, pertinent data, examined the patient and developed the plan of care. Pager:  [phone]

## 2012-04-02 NOTE — Progress Notes (Signed)
Speech Language Pathology Dysphagia Treatment Patient Details Name: [name] MRN: [id number] DOB: 1928/01/28 Today's Date: 04/02/2012 Time: 1005-1030 SLP Time Calculation (min): 25 min  Assessment / Plan / Recommendation Clinical Impression  Treatment focused on clinician provided diagnostic po trials to determine readiness to advance diet. Patient able to consume provided pos without overt s/s of aspiration including clear vocal quality through out intake. Moderate verbal cueing provided for full clearance of oral cavity and continued po intake/self feeding due to poor sustained attention. Oral transit time delayed but functional due to combination of missing dentition (bottom) and AMS. Aware that patient has a h/o silent aspiration  in 2005 however according to son has never been on an altered diet and has been without episodes of PNA or respiratory difficulties. Will initiate a dysphagia 3 diet, thin liquids with close f/u to ensure tolerance. Do note moderate cognitive impairements. Patient would benefit from cognitive-linguistic evaluation as well. MD please order if agree.     Diet Recommendation  Initiate / Change Diet: Dysphagia 3 (mechanical soft);Thin liquid    SLP Plan Goals updated   Pertinent Vitals/Pain n/a   Swallowing Goals  SLP Swallowing Goals Patient will utilize recommended strategies during swallow to increase swallowing safety with: Minimal assistance Swallow Study Goal #2 - Progress: Not met Goal #3: Patient will maintain aroused state without clinician cues for diagnostic po trials at bedside.  Swallow Study Goal #3 - Progress: Met  General Temperature Spikes Noted: No Respiratory Status: Room air Behavior/Cognition: Lethargic;Confused Oral Cavity - Dentition: Dentures, top Patient Positioning: Upright in chair  Oral Cavity - Oral Hygiene Does patient have any of the following "at risk" factors?: Nutritional status - inadequate Patient is AT RISK - Oral  Care Protocol followed (see row info): Yes   Dysphagia Treatment Treatment focused on: Upgraded PO texture trials;Facilitation of oral phase Treatment Methods/Modalities: Skilled observation;Differential diagnosis Patient observed directly with PO's: Yes Type of PO's observed: Thin liquids;Dysphagia 3 (soft);Dysphagia 1 (puree) Feeding: Able to feed self;Needs assist (due to poor sustained attention) Liquids provided via: Cup;Straw Oral Phase Signs & Symptoms: Prolonged oral phase (likely a combination of AMS and missing dentition) Type of cueing: Verbal Amount of cueing: Moderate   GO    Andrea Lango MA, CCC-SLP [phone]  [name] 04/02/2012, 11:08 AM

## 2012-04-02 NOTE — Progress Notes (Signed)
Physical Therapy Re- Evaluation Patient Details Name: [name] MRN: [id number] DOB: 12/04/1927 Today's Date: 04/02/2012 Time: 6578-4696 PT Time Calculation (min): 26 min  PT Assessment / Plan / Recommendation Clinical Impression  Pt is 78 yo female who presented with generalized weakness after falling and sustained SAH. Pt then sustained right CVA and now has profound left sided weakness and left neglect.  Is requiring +2 assist for mobility in and out of the bed.  Recommend SNF at d/c, PT will follow acutely.      PT Assessment  Patient needs continued PT services    Follow Up Recommendations  SNF;Supervision/Assistance - 24 hour    Does the patient have the potential to tolerate intense rehabilitation      Barriers to Discharge Decreased caregiver support rec SNF    Equipment Recommendations  None recommended by PT;None recommended by OT    Recommendations for Other Services OT consult;Speech consult   Frequency Min 3X/week    Precautions / Restrictions Precautions Precautions: Fall Restrictions Weight Bearing Restrictions: No   Pertinent Vitals/Pain Pt c/o HA end of session, RN made aware HR 76 bpm with >10 PVCs, O2 sats 97%, BP 164/65      Mobility  Bed Mobility Bed Mobility: Rolling Left;Left Sidelying to Sit;Sitting - Scoot to [place] of Bed Rolling Left: 1: +2 Total assist;With rail Rolling Left: Patient Percentage: 20% Left Sidelying to Sit: 1: +2 Total assist Left Sidelying to Sit: Patient Percentage: 10% Sitting - Scoot to Edge of Bed: 1: +2 Total assist Sitting - Scoot to Edge of Bed: Patient Percentage: 0% Details for Bed Mobility Assistance: pt needing total assist for mvmt of LLE, pt negelcts left side but was able to turn head to left to reach for rail with max vc's and increased time and min tactile cues. Pt unable to wt-shift to be able to scoot to EOB.  In sitting, pt with heavy left lean, needing max A to maintain sitting.  When instructed to  lean onto right elbow, pt able to maintain this position with min guard A.  Pt performed reaching activities with right UE, reaching upward helped improve sitting posture but pt unable to reach across midline without tactile assist. Transfers Transfers: Sit to Stand;Stand to Sit;Stand Pivot Transfers Sit to Stand: 1: +2 Total assist;From bed;From elevated surface;With upper extremity assist Sit to Stand: Patient Percentage: 30% Stand to Sit: To chair/3-in-1;With upper extremity assist;1: +2 Total assist Stand to Sit: Patient Percentage: 20% Stand Pivot Transfers: 1: +2 Total assist;From elevated surface Stand Pivot Transfers: Patient Percentage: 30% Details for Transfer Assistance: pt was able to initiate push through floor with right LE and quad activation for sit to stand. Kyphotic posture with max facilitation at hips for shifting wt toward chair. Left foot and knee blocked with standing, left side supported.  Ambulation/Gait Ambulation/Gait Assistance: Not tested (comment) (unable) Stairs: No Wheelchair Mobility Wheelchair Mobility: No Modified Rankin (Stroke Patients Only) Pre-Morbid Rankin Score: Moderately severe disability Modified Rankin: Severe disability    Shoulder Instructions     Exercises     PT Diagnosis: Hemiplegia non-dominant side;Generalized weakness;Acute pain;Difficulty walking  PT Problem List: Decreased strength;Decreased range of motion;Decreased activity tolerance;Decreased balance;Decreased mobility;Decreased coordination;Decreased cognition;Decreased knowledge of use of DME;Decreased safety awareness;Decreased knowledge of precautions;Cardiopulmonary status limiting activity;Pain PT Treatment Interventions: DME instruction;Gait training;Functional mobility training;Therapeutic activities;Therapeutic exercise;Balance training;Neuromuscular re-education;Cognitive remediation;Patient/family education   PT Goals Acute Rehab PT Goals PT Goal Formulation: With  patient/family Time For Goal Achievement: 04/16/12 Potential to Achieve  Goals: Fair Pt will go Supine/Side to Sit: with min assist PT Goal: Supine/Side to Sit - Progress: Goal set today Pt will Sit at [facility] of Bed: 3-5 min;with supervision;with no upper extremity support PT Goal: Sit at Edge Of Bed - Progress: Goal set today Pt will go Sit to Supine/Side: with min assist PT Goal: Sit to Supine/Side - Progress: Goal set today Pt will go Sit to Stand: with +2 total assist;Other (comment) (pt 50%) PT Goal: Sit to Stand - Progress: Goal set today Pt will go Stand to Sit: with +2 total assist;Other (comment) (pt 50%) PT Goal: Stand to Sit - Progress: Goal set today Pt will Transfer Bed to Chair/Chair to Bed: with +2 total assist;Other (comment) (pt 50%) PT Transfer Goal: Bed to Chair/Chair to Bed - Progress: Goal set today PT Goal: Ambulate - Progress: Discontinued (comment) (no longer appropriate)  Visit Information  Last PT Received On: 04/02/12 Assistance Needed: +2    Subjective Data  Subjective: Did I do OK? Patient Stated Goal: did not state   Prior Functioning  Home Living Lives With: Other (Comment) (ALF) Available Help at Discharge: Skilled Nursing Facility Type of Home: Assisted living Home Access: Level entry Home Adaptive Equipment: Walker - standard;Bedside commode/3-in-1 Prior Function Level of Independence: Needs assistance Able to Take Stairs?: No Driving: No Vocation: Retired Musician: [employer] Dominant Hand: Right    Cognition  Overall Cognitive Status: Impaired Area of Impairment: Awareness of errors;Awareness of deficits;Safety/judgement Arousal/Alertness: Awake/alert Orientation Level: Time;Disoriented to Behavior During Session: Anxious Safety/Judgement: Decreased awareness of need for assistance;Decreased awareness of safety precautions Safety/Judgement - Other Comments: pt with left neglect, decreasing safety Awareness of Errors:  Assistance required to identify errors made;Assistance required to correct errors made Awareness of Errors - Other Comments: pt unaware of her left side and inability to move it Cognition - Other Comments: pt distressed by current situation and seems scared and confused    Extremity/Trunk Assessment Right Upper Extremity Assessment RUE ROM/Strength/Tone: Deficits;Unable to fully assess;Due to impaired cognition RUE ROM/Strength/Tone Deficits: generalized weakness. RUE Sensation: WFL - Light Touch Left Upper Extremity Assessment LUE ROM/Strength/Tone: Deficits;Unable to fully assess;Due to impaired cognition LUE ROM/Strength/Tone Deficits: pt able to squeeze hand slightly, otherwise no active movement noted LUE Sensation: Deficits LUE Sensation Deficits: sensation diminished LUE Coordination: Deficits Right Lower Extremity Assessment RLE ROM/Strength/Tone: Deficits;Unable to fully assess;Due to impaired cognition RLE ROM/Strength/Tone Deficits: generalized weakness noted and difficult to accurately assess due to cognitive deficits but pt able to move right ankle and get right leg off bed RLE Sensation: WFL - Light Touch RLE Coordination: WFL - gross motor Left Lower Extremity Assessment LLE ROM/Strength/Tone: Deficits;Unable to fully assess;Due to impaired cognition LLE ROM/Strength/Tone Deficits: no active mvmt of the LLE noted LLE Sensation: Deficits LLE Sensation Deficits: diminshed to touch LLE Coordination: Deficits LLE Coordination Deficits: pt with increased tone LLE Trunk Assessment Trunk Assessment: Kyphotic   Balance Balance Balance Assessed: Yes Static Sitting Balance Static Sitting - Balance Support: Right upper extremity supported;Feet supported Static Sitting - Level of Assistance: 2: Max assist;4: Min assist;Other (comment) (min A when leaning on right elbow) Dynamic Sitting Balance Dynamic Sitting - Balance Support: Right upper extremity supported;Feet  supported Dynamic Sitting - Level of Assistance: 2: Max assist Reach (Patient is able to reach ___ inches to right, left, forward, back): 3 in to right, unable to reach past midline to left  End of Session PT - End of Session Equipment Utilized During Treatment: Gait belt Activity  Tolerance: Treatment limited secondary to medical complications (Comment) (left sided weakness) Patient left: in chair;with call bell/phone within reach Nurse Communication: Mobility status;Need for lift equipment  GP    Lyanne Co, PT  Acute Rehab Services  [phone]  Lyanne Co 04/02/2012, 10:00 AM

## 2012-04-02 NOTE — Clinical Social Work Note (Signed)
Clinical Social Worker met with patient and patient son at bedside to further discuss placement options.  Patient and patient son have discussed options with friends and family and have decided on [facility], [place], [address], and [place] in that order.  CSW to contact facilities in the morning to discuss bed availability and possible bed offer.  Patient son agreeable and will be available by phone if not at bedside as needed.  CSW will submit for [facility] once 30 day note has been signed.  Clinical Social Worker remains available for support and to facilitate patient discharge plans.  Macario Golds, [place] [phone]

## 2012-04-02 NOTE — Progress Notes (Signed)
Subjective: Patient reports alert, oriented  Objective: Vital signs in last 24 hours: Temp:  [98.1 F (36.7 C)-98.9 F (37.2 C)] 98.6 F (37 C) (11/20 0800) Pulse Rate:  [61-114] 61  (11/20 0800) Resp:  [17-24] 18  (11/20 0800) BP: (108-188)/(41-143) 138/74 mmHg (11/20 0800) SpO2:  [90 %-100 %] 96 % (11/20 0800)  Intake/Output from previous day: 11/19 0701 - 11/20 0700 In: 987.8 [I.V.:977.8; IV Piggyback:10] Out: 1610 [Urine:1610] Intake/Output this shift: Total I/O In: 100 [I.V.:100] Out: -   Physical Exam: Opens eyes, follows commands.  Plegic on left.  Moves right side with good power.  Lab Results:  [facility] 04/02/12 0440 03/31/12 0442  WBC 6.7 4.6  HGB 11.7* 10.9*  HCT 35.0* 33.4*  PLT 155 176   BMET  Basename 04/02/12 0440 03/31/12 0442  NA 142 138  K 3.2* 3.5  CL 109 105  CO2 26 25  GLUCOSE 136* 107*  BUN 8 13  CREATININE 0.59 0.65  CALCIUM 8.7 9.3    Studies/Results: Ct Head Wo Contrast  04/02/2012  *RADIOLOGY REPORT*  Clinical Data: Follow-up intracranial hemorrhage. History of traumatic fall.  No acute changes per the patient's nurse.  CT HEAD WITHOUT CONTRAST  Technique:  Contiguous axial images were obtained from the base of the skull through the vertex without contrast.  Comparison: 04/01/2012  most recent.  Also CT head 03/30/2012.  Findings: Large right frontoparietal intracerebral hemorrhage with surrounding edema is redemonstrated.  Hemorrhage may be minimally larger measuring 76 x 43 mm on today's exam.  As measured at the septum pellucidum, there is 5 mm right-to-left shift. Slight subarachnoid blood at the vertex and in the left parietal region. Slight peritentorial extension on the left also slightly into the interhemispheric fissure.  No uncal herniation.     Local sulcal effacement persists. Mild contusion left frontal cortex.  Moderate atrophy with chronic white matter changes are redemonstrated. No acute sinus or mastoid disease.  IMPRESSION:  Slight increase size right frontoparietal dilated intracerebral hemorrhage.  Slight 5 mm right-to-left shift is mildly increased. Slight left peritentorial extension and subarachnoid blood, stable.   Original Report Authenticated By: Davonna Belling, M.D.    Ct Head Wo Contrast  04/01/2012  *RADIOLOGY REPORT*  Clinical Data: Contracted left side.  CT HEAD WITHOUT CONTRAST  Technique:  Contiguous axial images were obtained from the base of the skull through the vertex without contrast.  Comparison: 03/30/2012  Findings: Interval intraparenchymal hemorrhage within the right frontal parietal lobe, measuring 6.8 x 4.0 cm.  Surrounding edema. Local sulcal effacement.  No midline shift.  Small areas of subarachnoid hemorrhage again noted bilaterally. Periventricular and subcortical white matter hypodensities are most in keeping with chronic microangiopathic change.  No hydrocephalous The visualized paranasal sinuses and mastoid air cells are predominately clear.  IMPRESSION: Interval 6.8 x 4.0 cm intraparenchymal hemorrhage within the right frontal parietal lobe. Local sulcal effacement however no midline shift. Underlying lesion not excluded.  Subarachnoid hemorrhage again noted.  Critical Value/emergent results were called by telephone at the time of interpretation on 04/01/2012 at 07:05 a.m. to Kevan Ny, who verbally acknowledged these results.   Original Report Authenticated By: Jearld Lesch, M.D.     Assessment/Plan: Head CT slightly worse, clinically stable.  Continue supportive care.  No role for surgery, both clinically and as per family wishes..  Please call if further concerns or questions.    LOS: 3 days    Dorian Heckle, MD 04/02/2012, 8:40 AM

## 2012-04-02 NOTE — Clinical Social Work Placement (Addendum)
    Clinical Social Work Department CLINICAL SOCIAL WORK PLACEMENT NOTE 04/02/2012  Patient:  [name], [name]  Account Number:  [id number] Admit date:  03/30/2012  Clinical Social Worker:  Macario Golds, Theresia Majors  Date/time:  04/02/2012 10:06 AM  Clinical Social Work is seeking post-discharge placement for this patient at the following level of care:   SKILLED NURSING   (*CSW will update this form in Epic as items are completed)   04/01/2012  Patient/family provided with [facility] Department of Clinical Social Work's list of facilities offering this level of care within the geographic area requested by the patient (or if unable, by the patient's family).  04/01/2012  Patient/family informed of their freedom to choose among providers that offer the needed level of care, that participate in Medicare, Medicaid or managed care program needed by the patient, have an available bed and are willing to accept the patient.  04/01/2012  Patient/family informed of [facility]' ownership interest in [facility], as well as of the fact that they are under no obligation to receive care at this facility.  PASARR submitted to EDS on 04/02/2012 PASARR number received from EDS on   FL2 transmitted to all facilities in geographic area requested by pt/family on  04/02/2012 FL2 transmitted to all facilities within larger geographic area on   Patient informed that his/her managed care company has contracts with or will negotiate with  certain facilities, including the following:     Patient/family informed of bed offers received:  04/03/2012 (JS) Patient chooses bed at  [facility] (JS) Physician recommends and patient chooses bed at    Patient to be transferred to  [facility] on   Patient to be transferred to facility by   The following physician request were entered in Epic:   Additional Comments:

## 2012-04-02 NOTE — Clinical Social Work Note (Signed)
Clinical Social Work Department BRIEF PSYCHOSOCIAL ASSESSMENT 04/02/2012  Patient:  MARGARET, COCKERILL     Account Number:  [id number]     Admit date:  03/30/2012  Clinical Social Worker:  Pearson Forster  Date/Time:  04/01/2012 05:50 PM  Referred by:  RN  Date Referred:  04/01/2012 Referred for  SNF Placement   Other Referral:   Interview type:  Family Other interview type:   Patient asleep and patient son at bedside requesting alternative discharge options    PSYCHOSOCIAL DATA Living Status:  FACILITY Admitted from facility:  [facility] Level of care:  Assisted Living Primary support name:  Shyane, [place]  [phone]   [phone] Primary support relationship to patient:  CHILD, ADULT Degree of support available:   Strong    CURRENT CONCERNS Current Concerns  Post-Acute Placement   Other Concerns:    SOCIAL WORK ASSESSMENT / PLAN Clinical Social Worker met with patient and patient son at bedside to offer support and discuss patient needs at discharge.  Patient son states that patient had just recently moved into [facility] and only been there about a week prior to her fall and hospitalization.  Prior to [facility], patient was at home with 24 hour caregivers through [facility] and home health following with [place].  Patient son realized that the cost of keeping patient at home was greater than an Assisted Living placement and therefore had just moved patient into [facility].  Patient son agrees that patient will not be able to return to [facility] in her current condition and is agreeable with SNF placement at this time.    CSW to initiate search in [facility] per family request.  Patient son states that he is the only family that patient has left and he is a single dad of 37 girls (18 year old tripletts, and 76 year old twins).  Patient son is having difficulties finding a good balance.  Patient son plans to discuss facility options with friends and  previous caregivers to determine choices for patient bed placement. CSW to follow up with patient and patient son to provide bed offers once available and facilitate patient discharge needs once medically ready.  CSW remains available for emotional support as needed.   Assessment/plan status:  Psychosocial Support/Ongoing Assessment of Needs Other assessment/ plan:   Information/referral to community resources:   Visual merchandiser provided patient son with facility list to look over and discuss with friends and previous caregivers.  Patient son states that he lives in the downtown [place] area and would prefer patient to placed in [place].    PATIENT'S/FAMILY'S RESPONSE TO PLAN OF CARE: Patient alert and oriented x3 but sleeping during CSW assessment.  Patient son spoke openly at bedside about patient current concerns for placement needs and patient inability to live independently.  Patient son agrees that patient would benefit from skilled nursing facility placement both short term and potentially long term. Patient son verbailzed his appreciation for CSW support and involvement.     Macario Golds, [place] [phone]

## 2012-04-03 DIAGNOSIS — K219 Gastro-esophageal reflux disease without esophagitis: Secondary | ICD-10-CM

## 2012-04-03 LAB — MAGNESIUM: Magnesium: 1.9 mg/dL (ref 1.5–2.5)

## 2012-04-03 LAB — BASIC METABOLIC PANEL
GFR calc Af Amer: >90 mL/min (ref >90)
GFR calc non Af Amer: 83 mL/min — ABNORMAL LOW (ref >90)
Potassium: 3.9 mEq/L (ref 3.5–5.1)
Sodium: 142 mEq/L (ref 135–145)

## 2012-04-03 LAB — PHOSPHORUS: Phosphorus: 2.3 mg/dL (ref 2.3–4.6)

## 2012-04-03 LAB — CBC
Platelets: 150 10*3/uL (ref 150–400)
RDW: 14.3 % (ref 11.5–15.5)
WBC: 5.4 10*3/uL (ref 4.0–10.5)

## 2012-04-03 MED ORDER — METOPROLOL TARTRATE 25 MG PO TABS
25.0000 mg | ORAL_TABLET | Freq: Two times a day (BID) | ORAL | Status: DC
  Administered 2012-04-03 – 2012-04-07 (×8): 25 mg via ORAL
  Filled 2012-04-03 (×14): qty 1

## 2012-04-03 MED ORDER — LEVOTHYROXINE SODIUM 125 MCG PO TABS
125.0000 ug | ORAL_TABLET | Freq: Every day | ORAL | Status: DC
  Administered 2012-04-04 – 2012-04-07 (×3): 125 ug via ORAL
  Filled 2012-04-03 (×7): qty 1

## 2012-04-03 MED ORDER — PANTOPRAZOLE SODIUM 40 MG PO TBEC
40.0000 mg | DELAYED_RELEASE_TABLET | Freq: Every day | ORAL | Status: DC
  Administered 2012-04-03 – 2012-04-07 (×3): 40 mg via ORAL
  Filled 2012-04-03 (×4): qty 1

## 2012-04-03 NOTE — Progress Notes (Addendum)
[facility] PROGRESS NOTE  PCCM Transfer 11/21  [name] WUJ:[id number] DOB: 02/22/1928 DOA: 03/30/2012 PCP: [name], MD  Assessment/Plan:                                         Subarachnoid hemorrhage and intraparenchymal bleed: She was initially admitted for fall and an initial CT demonstrated a small subarachnoid hemorrhage. She was admitted to hospitalist service and Neuro surgery called. Dr Venetia Maxon recommended observation and frequent neuro checks, Early 11/19 patient was found to be incontinent with left sided weakness, stat CT head done showed an interval intraparenchymal hemorrhage within the right frontal parietal lobe of 6.8x 4 cm. Dr Venetia Maxon was reconsulted , recommended transfer to [facility] for observation for 2 days, and no surgical intervention needed at this time. He spoke to the [facility], reported that his mom's wishes were no heroic measures and DNR.  Started on a Dysphagia 3 diet now Gleevac on hold due to hemorrhage At this point needs Rehab Tx out of ICU today Ambulate, PT/OT/Speech therapy  Fall  Likely due to deconditioning. Continue physical therapy. Currently at [facility]. PT recommended SNF.   Hypertension  PRN hydralazine for BP > 180 , resume PO metoprolol  GERD  Continue PPI, change to PO  Depression/bipolar disorder  Stable. Continue medications.   Chronic myelocytic leukemia  Will hold Gleevac now esp due to rebleeding and worsening, will notify Dr.Sherril    Hypothyroidism  Continue Synthroid, change to PO  Hypokalemia  Replace as needed.   Prophylaxis  SCDs.   Tx out of ICU today CSW following for placement, needs SNF  Code status: DNR FAmily contact: Son, Claudie Leach, I called and left msg for son today Disposition: SNF soon    Consultants:  Neurosurgery. Dr.Stern  HPI/Subjective:  comfortable. Speech clear but anxious, " I dont feel so good", L side still very weak  Objective: Filed Vitals:   04/03/12 0600 04/03/12 0700 04/03/12 0800 04/03/12 0900  BP: 137/58 139/55 140/61 153/66  Pulse: 68 59 63 62  Temp:   97.8 F (36.6 C)   TempSrc:   Oral   Resp: 20  19 18   Height:      Weight:      SpO2: 97% 99% 98% 99%    Intake/Output Summary (Last 24 hours) at 04/03/12 1026 Last data filed at 04/03/12 0900  Gross per 24 hour  Intake   1164 ml  Output    115 ml  Net   1049 ml   Filed Weights   04/01/12 0519 04/01/12 0700 04/03/12 0500  Weight: 78.1 kg (172 lb 2.9 oz) 77.4 kg (170 lb 10.2 oz) 82.4 kg (181 lb 10.5 oz)    Exam:   General:  Alert afebrile comfortable  Cardiovascular: s1s2  Respiratory: CTAB  Abdomen:soft NT ND BS+  Extemities; trace pedal edema  Neuro: LUE 0/5, LLE 1/5, Pt is a little drowsy but oriented to place , person and partly to time.   Data Reviewed: Basic Metabolic Panel:  Lab 04/03/12 9562 04/02/12 0440 03/31/12 0442 03/30/12 2105  NA 142 142 138 139  K 3.9 3.2* 3.5 3.3*  CL 109 109 105 103  CO2 23 26 25 26   GLUCOSE 128* 136* 107* 122*  BUN 7 8 13 14   CREATININE 0.59 0.59 0.65 0.77  CALCIUM 8.8 8.7 9.3 9.2  MG 1.9 -- 1.8 --  PHOS 2.3 -- -- --   Liver Function Tests: No results found for this basename: AST:5,ALT:5,ALKPHOS:5,BILITOT:5,PROT:5,ALBUMIN:5 in the last 168 hours No results found for this basename: LIPASE:5,AMYLASE:5 in the last 168 hours No results found for this basename: AMMONIA:5 in the last 168 hours CBC:  Lab 04/03/12 0521 04/02/12 0440 03/31/12 0442 03/30/12 2105  WBC 5.4 6.7 4.6 5.9  NEUTROABS -- -- -- 4.1  HGB 11.4* 11.7* 10.9* 11.4*  HCT 34.7* 35.0* 33.4* 34.5*  MCV 96.9 94.6 95.2 95.0  PLT 150 155 176 189   Cardiac Enzymes: No results found for this basename: CKTOTAL:5,CKMB:5,CKMBINDEX:5,TROPONINI:5 in the last 168 hours BNP (last 3 results) No results found for this basename: PROBNP:3 in the last 8760 hours CBG: No results found for this basename: GLUCAP:5 in the last 168 hours  Recent Results  (from the past 240 hour(s))  MRSA PCR SCREENING     Status: Abnormal   Collection Time   04/01/12  7:15 AM      Component Value Range Status Comment   MRSA by PCR POSITIVE (*) NEGATIVE Final      Studies: Ct Head Wo Contrast  04/02/2012  *RADIOLOGY REPORT*  Clinical Data: Follow-up intracranial hemorrhage. History of traumatic fall.  No acute changes per the patient's nurse.  CT HEAD WITHOUT CONTRAST  Technique:  Contiguous axial images were obtained from the base of the skull through the vertex without contrast.  Comparison: 04/01/2012  most recent.  Also CT head 03/30/2012.  Findings: Large right frontoparietal intracerebral hemorrhage with surrounding edema is redemonstrated.  Hemorrhage may be minimally larger measuring 76 x 43 mm on today's exam.  As measured at the septum pellucidum, there is 5 mm right-to-left shift. Slight subarachnoid blood at the vertex and in the left parietal region. Slight peritentorial extension on the left also slightly into the interhemispheric fissure.  No uncal herniation.     Local sulcal effacement persists. Mild contusion left frontal cortex.  Moderate atrophy with chronic white matter changes are redemonstrated. No acute sinus or mastoid disease.  IMPRESSION: Slight increase size right frontoparietal dilated intracerebral hemorrhage.  Slight 5 mm right-to-left shift is mildly increased. Slight left peritentorial extension and subarachnoid blood, stable.   Original Report Authenticated By: Davonna Belling, M.D.     Scheduled Meds:    . antiseptic oral rinse  15 mL Mouth Rinse BID  . beta carotene w/minerals  1 tablet Oral Daily  . Chlorhexidine Gluconate Cloth  6 each Topical Q0600  . darifenacin  15 mg Oral Daily  . levothyroxine  67.5 mcg Intravenous QAC breakfast  . metoprolol  2.5 mg Intravenous Q8H  . multivitamin with minerals  1 tablet Oral q morning - 10a  . mupirocin ointment  1 application Nasal BID  . Oxcarbazepine  300 mg Oral Daily  .  Oxcarbazepine  600 mg Oral QHS  . pantoprazole (PROTONIX) IV  40 mg Intravenous Q24H  . PARoxetine  30 mg Oral QHS  . [COMPLETED] potassium chloride  40 mEq Oral Once   Continuous Infusions:    . dextrose 5 % and 0.9 % NaCl with KCl 20 mEq/L 50 mL/hr at 04/03/12 4696    Principal Problem:  *Fall Active Problems:  Unspecified episodic mood disorder  Subarachnoid hemorrhage  Thyroid disease  HTN (hypertension)  GERD (gastroesophageal reflux disease)  Depression  Bipolar 1 disorder  CML (chronic myelocytic leukemia)  History of colon cancer  Anemia  Left-sided weakness  ICH (intracerebral hemorrhage)      Zannie Cove  [facility] Pager [phone]. If 8PM-8AM, please contact night-coverage at [email or web address], password [facility] 04/03/2012, 10:26 AM  LOS: 4 days

## 2012-04-03 NOTE — Clinical Social Work Note (Signed)
Clinical Social Worker continuing to follow patient and family for emotional support and to facilitate patient discharge needs.  During CSW visit with patient and son at bedside yesterday, patient agreed for son to make decisions regarding placement needs.  CSW spoke with patient son over the phone to provide offers.  Patient son is agreeable and has chosen [facility] for patient placement at discharge.  Patient son was agreeable for contact information to be shared with facility - CSW notified admissions coordinator for [facility].  Patient is scheduled for possible discharge tomorrow to [facility] pending Pasarr approval - facility aware.  Clinical Social Worker will remain available for support and to facilitate patient discharge needs once medically stable and Pasarr received.  Macario Golds, [place] [phone]

## 2012-04-03 NOTE — Progress Notes (Signed)
Stroke Team Progress Note  HISTORY  [name] is an 78 y.o. female Who was recently admitted to [facility] hospital after a traumatic fall which resulted in subarachnoid hemorrhage with small amount of blood at the free edge of the left tentorium, question subarachnoid versus minimal subdural blood. Given small size of SAH neurosurgery felt conservative treatment was best. The following day patient was noted to be incontinent, LUE and LE flaccidity. Patient was sent back to CT scanner to evaluate for further intracranial bleed. Second CT scan revealed 6.8 X 4.0 in the right parietal lobe with no midline shift. Patient was then transferred to [facility] neuro ICU and neurology was asked to consult to help with further management. Due to decline patient is now DNR   SUBJECTIVE  Patient lying in bed. Groggy. Easily arouses, answers questions.   OBJECTIVE Most recent Vital Signs: Filed Vitals:   04/03/12 0300 04/03/12 0400 04/03/12 0500 04/03/12 0600  BP: 125/52 109/47 115/49 137/58  Pulse: 63 62 62 68  Temp:      TempSrc:      Resp: 19 18 19 20   Height:      Weight:   82.4 kg (181 lb 10.5 oz)   SpO2: 100% 100% 100% 97%   IV Fluid Intake:      . dextrose 5 % and 0.9 % NaCl with KCl 20 mEq/L 50 mL/hr at 04/03/12 0600    MEDICATIONS     . antiseptic oral rinse  15 mL Mouth Rinse BID  . beta carotene w/minerals  1 tablet Oral Daily  . Chlorhexidine Gluconate Cloth  6 each Topical Q0600  . darifenacin  15 mg Oral Daily  . levothyroxine  67.5 mcg Intravenous QAC breakfast  . metoprolol  2.5 mg Intravenous Q8H  . multivitamin with minerals  1 tablet Oral q morning - 10a  . mupirocin ointment  1 application Nasal BID  . Oxcarbazepine  300 mg Oral Daily  . Oxcarbazepine  600 mg Oral QHS  . pantoprazole (PROTONIX) IV  40 mg Intravenous Q24H  . PARoxetine  30 mg Oral QHS  . [COMPLETED] potassium chloride  40 mEq Oral Once   PRN:  acetaminophen, fentaNYL, hydrALAZINE, LORazepam,  nitroGLYCERIN, ondansetron (ZOFRAN) IV  Diet:  Dysphagia 3 Activity: Up with assistance DVT Prophylaxis:  SCD  CLINICALLY SIGNIFICANT STUDIES Basic Metabolic Panel:   Lab 04/03/12 0521 04/02/12 0440 03/31/12 0442  NA 142 142 --  K 3.9 3.2* --  CL 109 109 --  CO2 23 26 --  GLUCOSE 128* 136* --  BUN 7 8 --  CREATININE 0.59 0.59 --  CALCIUM 8.8 8.7 --  MG 1.9 -- 1.8  PHOS 2.3 -- --   Liver Function Tests: No results found for this basename: AST:2,ALT:2,ALKPHOS:2,BILITOT:2,PROT:2,ALBUMIN:2 in the last 168 hours CBC:   Lab 04/03/12 0521 04/02/12 0440 03/30/12 2105  WBC 5.4 6.7 --  NEUTROABS -- -- 4.1  HGB 11.4* 11.7* --  HCT 34.7* 35.0* --  MCV 96.9 94.6 --  PLT 150 155 --   Coagulation:   Lab 03/30/12 2105  LABPROT 13.8  INR 1.07    Urine Drug Screen:     Component Value Date/Time   LABOPIA POSITIVE* 11/30/2010 1130   COCAINSCRNUR NONE DETECTED 11/30/2010 1130   LABBENZ NONE DETECTED 11/30/2010 1130   AMPHETMU NONE DETECTED 11/30/2010 1130   THCU NONE DETECTED 11/30/2010 1130   LABBARB NONE DETECTED 11/30/2010 1130   Ct Head Wo Contrast 04/01/2012   Interval 6.8 x  4.0 cm intraparenchymal hemorrhage within the right frontal parietal lobe. Local sulcal effacement however no midline shift. Underlying lesion not excluded.  Subarachnoid hemorrhage again noted.   03/30/2012  Few scattered foci of subarachnoid hemorrhage are seen within sulci at hemispheres bilaterally with an additional small amount of blood at the free edge of the left tentorium, question subarachnoid versus minimal subdural blood 04/02/2012--pending  CXR  No active disease  EKG  normal EKG, normal sinus rhythm, unchanged from previous tracings, normal sinus rhythm, LBBB.   Therapy Recommendations PT - ; OT - ; ST - SNF  Physical Exam   Mental Status:  Alert, oriented, thought content appropriate. Speech with slight dysarthria without evidence of aphasia. Able to follow 3 step commands without  difficulty.  Cranial Nerves:  II: Discs flat bilaterally; Visual fields grossly normal, pupils equal, round, reactive to light and accommodation  III,IV, VI: ptosis not present, extra-ocular motions intact bilaterally  V,VII: smile symmetric at rest has slight left NL fold decrease, facial light touch sensation normal bilaterally  VIII: hearing normal bilaterally  IX,X: gag reflex present  HY:QMVHQIONG shoulder shrug on the left.  XII: midline tongue extension  Motor:  Right : Upper extremity 4+/5 Left: Upper extremity 0/5  Lower extremity 4+/5 Lower extremity 1/5 (able to slightly abduct at hip)  Tone and bulk:normal tone throughout; no atrophy noted  Sensory: Pinprick and light touch decreased on left arm and leg. Neglect left arm and leg to DSS.  Deep Tendon Reflexes: 2+ and symmetric throughout with minimal AJ  Plantars:  Right: downgoing Left: upgoing  Cerebellar:  normal finger-to-nose, normal heel-to-shin test normal on right-unable to perform on left.  CV: pulses palpable throughout     ASSESSMENT Ms. [name] is a 78 y.o. female presenting with traumatic fall. Imaging confirms subarachnoid hemorrhage, medical management at this time. On no anticoagulants prior to admission. Now on no anticoagulants for secondary stroke prevention. Patient with resultant left sided paralysis from new right brain hemorrhage after admission etiology of which is not clear- hypertensive versus amyloid or hemorrrhagic infarct   Hypothyroidism  Hypertension  Gastroesophageal reflux disease  Depression  Hypokalemia  Bipolar disease 1  Chronic myelocystic leukemia  Hospital day # 4  TREATMENT/PLAN  Continue current support for hemorrhage keeping SBP < 160  No antiplatelet therapy necessary  Ok to transfer out of unit  DNR Stroke Service will sign off. Please call if further questions/concerns    Guy Franco, [facility],  MBA, MHA [facility] Pager:  [phone] 04/03/2012 7:30 AM  Dr. Delia Heady, Stroke Center Medical Director has personally reviewed chart, pertinent data, examined the patient and developed the plan of care. Pager:  [phone]

## 2012-04-03 NOTE — Progress Notes (Signed)
SLP Cancellation Note  Patient Details Name: [name] MRN: [id number] DOB: 06/13/1927   Cancelled treatment:       Reason Eval/Treat Not Completed: Fatigue/lethargy limiting ability to participate (patient recently recieved ativan per RN. )  [name] MA, CCC-SLP [phone]  [name] 04/03/2012, 2:36 PM

## 2012-04-04 LAB — BASIC METABOLIC PANEL
Chloride: 103 mEq/L (ref 96–112)
Creatinine, Ser: 0.55 mg/dL (ref 0.50–1.10)
GFR calc Af Amer: >90 mL/min (ref >90)
Potassium: 3.4 mEq/L — ABNORMAL LOW (ref 3.5–5.1)
Sodium: 135 mEq/L (ref 135–145)

## 2012-04-04 MED ORDER — LORAZEPAM 0.5 MG PO TABS: 0.5000 mg | ORAL_TABLET | Freq: Four times a day (QID) | ORAL | Status: DC | PRN

## 2012-04-04 MED ORDER — HYDRALAZINE HCL 20 MG/ML IJ SOLN
5.0000 mg | Freq: Three times a day (TID) | INTRAMUSCULAR | Status: DC | PRN
  Administered 2012-04-04: 5 mg via INTRAVENOUS
  Filled 2012-04-04: qty 0.25

## 2012-04-04 NOTE — Progress Notes (Signed)
Physical Therapy Treatment Patient Details Name: [name] MRN: [id number] DOB: 12-10-1927 Today's Date: 04/04/2012 Time: 0865-7846 PT Time Calculation (min): 24 min  PT Assessment / Plan / Recommendation Comments on Treatment Session  This session was limited by pt's limited response to commands and provided no (A) with bed mobility. Pt. remained lathargic and required +2 (A) for all positioning. Attempted to trasnfer to chair and pt. provided no (A). Pt. repositioned in supine in bed where she then began to complain of pain (see vitals section) and RN notified. PT/OT requested for RN to position pt. In recliner for some time during the day, via lift.     Follow Up Recommendations  SNF;Supervision/Assistance - 24 hour     Does the patient have the potential to tolerate intense rehabilitation     Barriers to Discharge        Equipment Recommendations  None recommended by PT;None recommended by OT    Recommendations for Other Services OT consult;Speech consult  Frequency Min 3X/week   Plan      Precautions / Restrictions Precautions Precautions: Fall Restrictions Weight Bearing Restrictions: No   Pertinent Vitals/Pain Patient began having complaints of pain when repositioned in supine at end of session. Patient pointing to her head and abdominal area with significant facial grimacing. Pt. Did not give numerical rating of pain. RN notified    Mobility  Bed Mobility Bed Mobility: Rolling Right;Rolling Left;Supine to Sit;Sitting - Scoot to [place] of Bed;Sit to Supine;Scooting to [facility] Rolling Right: 1: +2 Total assist Rolling Right: Patient Percentage: 0% Rolling Left: 1: +2 Total assist;With rail Rolling Left: Patient Percentage: 0% Supine to Sit: 1: +2 Total assist Supine to Sit: Patient Percentage: 0% Sitting - Scoot to Edge of Bed: 1: +2 Total assist Sitting - Scoot to Edge of Bed: Patient Percentage: 0% Sit to Supine: 1: +2 Total assist Sit to Supine: Patient  Percentage: 0% Scooting to HOB: 1: +2 Total assist Scooting to [facility]: Patient Percentage: 0% Details for Bed Mobility Assistance: Pt. requiring total (A) +2 for all parts of bed mobility, providing 0% during today's session. Pt. began having complaints of pain when she was repositioned in supine pointing to her head and abdominal area; RN notified. Transfers Details for Transfer Assistance: Attempted sit>stand at EOB and pt's hips did not clear the bed. Pt. not (A)ing with Rt LE as was documented from previous sessions. Pt. remained lathargic during attempt to transfer. Ambulation/Gait Ambulation/Gait Assistance: Not tested (comment) Stairs: No Wheelchair Mobility Wheelchair Mobility: No Modified Rankin (Stroke Patients Only) Pre-Morbid Rankin Score: Moderately severe disability Modified Rankin: Severe disability      PT Goals Acute Rehab PT Goals PT Goal Formulation: With patient/family Time For Goal Achievement: 04/16/12 Potential to Achieve Goals: Fair Pt will go Supine/Side to Sit: with min assist PT Goal: Supine/Side to Sit - Progress: Not progressing Pt will Sit at Edge of Bed: 3-5 min;with supervision;with no upper extremity support PT Goal: Sit at Edge Of Bed - Progress: Progressing toward goal Pt will go Sit to Supine/Side: with min assist PT Goal: Sit to Supine/Side - Progress: Not progressing Pt will go Sit to Stand: with +2 total assist;Other (comment) PT Goal: Sit to Stand - Progress: Not progressing Pt will go Stand to Sit: with +2 total assist;Other (comment) PT Goal: Stand to Sit - Progress: Not progressing Pt will Transfer Bed to Chair/Chair to Bed: with +2 total assist;Other (comment)  Visit Information  Last PT Received On: 04/04/12 Assistance Needed: +2  PT/OT Co-Evaluation/Treatment: Yes    Subjective Data      Cognition  Overall Cognitive Status: Impaired Area of Impairment: Awareness of errors;Awareness of deficits Arousal/Alertness:  Lethargic Behavior During Session: Lethargic Cognition - Other Comments: Pt. very lathargic throughout session and remained with her eyes closed most of the session with short periods of opening her eyes.    Balance  Balance Balance Assessed: Yes Static Sitting Balance Static Sitting - Balance Support: No upper extremity supported;Feet supported Static Sitting - Level of Assistance: 2: Max assist Static Sitting - Comment/# of Minutes: Pt. sat EOB for ~5 mins with max (A) to maintain sitting posture. When pt. was not supported she would lean to the left in a slow manner but pt with not attempt to correct back to midline.  Pt. able to maintain holding her head up for ~30 seconds before letting it fall into a flexed position. Pt. given washcloth and perseverating on the activity once asked to stop.  End of Session PT - End of Session Equipment Utilized During Treatment: Gait belt Activity Tolerance: Other (comment) (Limited to pt's limited response) Patient left: in bed;with call bell/phone within reach;with bed alarm set Nurse Communication: Mobility status;Need for lift equipment;Patient requests pain meds;Other (comment) (PT/OT requesting pt. being lifted into recliner during day.)    Mertie Clause, SPTA 04/04/2012, 10:52 AM   Verdell Face, PTA [phone] 04/04/2012

## 2012-04-04 NOTE — Progress Notes (Signed)
Speech Language Pathology Dysphagia Treatment Patient Details Name: [name] MRN: [id number] DOB: 02-Feb-1928 Today's Date: 04/04/2012 Time: 7829-5621 SLP Time Calculation (min): 8 min  Assessment / Plan / Recommendation Clinical Impression  Pt. seen for dysphagia treatment and ability to safely consume diet with compensatory techniques.  Session was limited as pt. sleepy following administration of pain meds.  Max verbal and tactile cues to arouse and consume sips thin water via straw without indications of penetration or aspiration.  Did not observe with cracker due to current lethargy.  RN reports pt. may be transferred to SNF today.  Recommend continue mechanical soft diet and thin liquids with SLP follow up at SNF.    Diet Recommendation  Continue with Current Diet: Dysphagia 3 (mechanical soft);Thin liquid    SLP Plan Continue with current plan of care   Pertinent Vitals/Pain No pain   Swallowing Goals  SLP Swallowing Goals Goal #3: Patient will maintain aroused state without clinician cues for diagnostic po trials at bedside.  Swallow Study Goal #3 - Progress: Progressing toward goal  General Temperature Spikes Noted: No Respiratory Status: Room air Behavior/Cognition: Lethargic;Requires cueing Oral Cavity - Dentition: Dentures, top Patient Positioning: Upright in bed  Oral Cavity - Oral Hygiene Does patient have any of the following "at risk" factors?: None of the above Brush patient's teeth BID with toothbrush (using toothpaste with fluoride): Yes Patient is AT RISK - Oral Care Protocol followed (see row info): Yes   Dysphagia Treatment Treatment focused on: Skilled observation of diet tolerance Treatment Methods/Modalities: Skilled observation Patient observed directly with PO's: Yes Type of PO's observed: Thin liquids Feeding: Total assist Liquids provided via: Straw Type of cueing: Verbal;Tactile Amount of cueing: Maximal   GO     Breck Coons Camary Sosa  M.Ed [employer] [phone]  04/04/2012

## 2012-04-04 NOTE — Evaluation (Signed)
Occupational Therapy Re-Evaluation Patient Details Name: [name] MRN: [id number] DOB: February 12, 1928 Today's Date: 04/04/2012 Time: 4098-1191 OT Time Calculation (min): 27 min  OT Assessment / Plan / Recommendation Clinical Impression  This 78 yo first evaled on 03/31/2012 due to fall and SAH, since admission she has had a right CVA and now has profound left sided deficits, thus this is a re-eval. Pt will benefit from acute OT with followup OT at SNF.    OT Assessment  Patient needs continued OT Services    Follow Up Recommendations  SNF    Barriers to Discharge Decreased caregiver support    Equipment Recommendations  None recommended by PT;None recommended by OT       Frequency  Min 3X/week    Precautions / Restrictions Precautions Precautions: Fall Restrictions Weight Bearing Restrictions: No   Pertinent Vitals/Pain Grimace when we laid her back down from sitting EOB. Pt pointed to her lower abdomen/pubic area and then her neck (when asked where the pain was) RN notified    ADL  Eating/Feeding: Simulated;+1 Total assistance Where Assessed - Eating/Feeding: Bed level Grooming: Performed;Wash/dry face;Set up;Supervision/safety (pt perseverated on this task) Where Assessed - Grooming: Supine, head of bed up Upper Body Bathing: Simulated;Maximal assistance Where Assessed - Upper Body Bathing: Supine, head of bed up Lower Body Bathing: Simulated;+2 Total assistance (one to help roll and hold pt on her side) Lower Body Bathing: Patient Percentage: 0% Where Assessed - Lower Body Bathing: Supine, head of bed up;Supine, head of bed flat;Rolling right and/or left Upper Body Dressing: Simulated;+1 Total assistance Where Assessed - Upper Body Dressing: Supine, head of bed up Lower Body Dressing: Simulated;+2 Total assistance Lower Body Dressing: Patient Percentage: 0% Where Assessed - Lower Body Dressing:  (one to help roll and hold pt on her side) Equipment Used: Gait  belt Transfers/Ambulation Related to ADLs: Attempted sit to stand x 2 from bed however pt did not attempt to help at all--patient quite lethargic today. ADL Comments: Woked on sitting EOB balance, holding head up, and moving eyes and head left and right: EOB=Max A with pt not completely falling over when unsupported but gradually going down to her right and backwards; Holding head up= initially had to help pt get her neck fully exteneded to neutral from full flexion once there she was able to hold it up about 30 seconds and then like her posture slowly went back into full neck flexion;  Looking right and left=woudl look right up head fully supported with head and eyes, butr would only go back towards the left to neutral.    OT Diagnosis: Disturbance of vision;Hemiplegia non-dominant side  OT Problem List: Decreased range of motion;Decreased activity tolerance;Impaired balance (sitting and/or standing);Impaired vision/perception;Decreased cognition;Decreased safety awareness;Decreased knowledge of use of DME or AE;Impaired sensation;Impaired UE functional use OT Treatment Interventions: Self-care/ADL training;Therapeutic exercise;Neuromuscular education;DME and/or AE instruction;Therapeutic activities;Visual/perceptual remediation/compensation;Cognitive remediation/compensation;Patient/family education;Balance training   OT Goals Acute Rehab OT Goals OT Goal Formulation: Patient unable to participate in goal setting Time For Goal Achievement: 04/18/12 Potential to Achieve Goals: Fair ADL Goals Pt Will Perform Grooming: with set-up;with supervision;Supported;Supine, head of bed up;Sitting, chair (1 task without perseveration) ADL Goal: Grooming - Progress: Goal set today Pt Will Transfer to Toilet: with 2+ total assist;Squat pivot transfer;3-in-1 (pt=30%) ADL Goal: Toilet Transfer - Progress: Goal set today ADL Goal: Toileting - Clothing Manipulation - Progress: Discontinued (comment) (Due to change  in medical and functional status) ADL Goal: Toileting - Hygiene - Progress: Discontinued (comment) (  Due to change in medical and functional status) ADL Goal: Additional Goal #1 - Progress: Discontinued (comment) (Due to change in medical and functional status) Miscellaneous OT Goals Miscellaneous OT Goal #1: Pt will be able to locate item on table in front of her with no more than 3 VC's per object she is trying to find. OT Goal: Miscellaneous Goal #1 - Progress: Goal set today Miscellaneous OT Goal #2: Pt will roll left with Mod A to A with BADLs. OT Goal: Miscellaneous Goal #2 - Progress: Goal set today Miscellaneous OT Goal #3: Pt will follow 1-step commands 50% of time. OT Goal: Miscellaneous Goal #3 - Progress: Goal set today Miscellaneous OT Goal #4: Pt will be able to sit EOB with head up for >3 minutes. OT Goal: Miscellaneous Goal #4 - Progress: Goal set today  Visit Information  Last OT Received On: 04/04/12 Assistance Needed: +2    Subjective Data  Subjective: "what makes that do that?" (in reference to her point to her lower abdominal area/pubic area in response to where does it hurt--pt grimacing)--RN made aware   Prior Functioning     Home Living Lives With:  (ALF) Available Help at Discharge: Skilled Nursing Facility Type of Home: Assisted living Home Access: Level entry Prior Function Able to Take Stairs?: No Driving: No Vocation: Retired Musician: HOH;Expressive difficulties;Receptive difficulties Dominant Hand: Right         Vision/Perception Vision - Assessment Vision Assessment: Vision impaired - to be further tested in functional context Additional Comments: Pt with tendency to keeps eyes closed most of session , would open them intermittently when asked but them close them again. Tendency is head and eyes turned to the right   Cognition  Overall Cognitive Status: Impaired Area of Impairment: Awareness of errors;Awareness of  deficits Arousal/Alertness: Lethargic Behavior During Session: Lethargic Awareness of Deficits: Pt unaware of left side and arm is totally flaccid Cognition - Other Comments: Pt. very lathargic throughout session and remained with her eyes closed most of the session with short periods of opening her eyes.    Extremity/Trunk Assessment Right Upper Extremity Assessment RUE ROM/Strength/Tone Deficits: Used arm functionally to use washcloth to wash her face, did wash both sides--but right side more. Left Upper Extremity Assessment LUE ROM/Strength/Tone: Deficits LUE ROM/Strength/Tone Deficits: Brunstrum 1, full PROM, shoulder subluxed     Mobility Bed Mobility Bed Mobility: Rolling Right;Rolling Left;Supine to Sit;Sitting - Scoot to [place] of Bed;Sit to Supine;Scooting to [facility] Rolling Right: 1: +2 Total assist Rolling Right: Patient Percentage: 0% Rolling Left: 1: +2 Total assist;With rail Rolling Left: Patient Percentage: 0% Supine to Sit: 1: +2 Total assist Supine to Sit: Patient Percentage: 0% Sitting - Scoot to Edge of Bed: 1: +2 Total assist Sitting - Scoot to Edge of Bed: Patient Percentage: 0% Sit to Supine: 1: +2 Total assist Sit to Supine: Patient Percentage: 0% Scooting to HOB: 1: +2 Total assist Scooting to [facility]: Patient Percentage: 0% Details for Bed Mobility Assistance: Pt. requiring total (A) +2 for all parts of bed mobility, providing 0% during today's session. Pt. began having complaints of pain when she was repositioned in supine pointing to her head and abdominal area; RN notified. Transfers Details for Transfer Assistance: Attempted sit>stand at EOB and pt's hips did not clear the bed. Pt. not (A)ing with Rt LE as was documented from previous sessions. Pt. remained lathargic during attempt to transfer.           Balance Balance Balance Assessed: Yes  Static Sitting Balance Static Sitting - Balance Support: No upper extremity supported;Feet supported Static Sitting -  Level of Assistance: 2: Max assist Static Sitting - Comment/# of Minutes: Pt. sat EOB for ~5 mins with max (A) to maintain sitting posture. When pt. was not supported she would slow the descent to the bed, but did not attempt to sit back up on her own. Pt. able to maintain holding her head up for ~30 seconds before letting it fall into a flexed position. Pt. given washcloth and perseverating on the activity once asked to stop.   End of Session OT - End of Session Equipment Utilized During Treatment: Gait belt Activity Tolerance: Patient limited by fatigue Patient left: in bed;with bed alarm set Nurse Communication:  (Need to get pt up with lift and that she was wet)       Evette Georges [phone] 04/04/2012, 12:04 PM

## 2012-04-04 NOTE — Clinical Social Work Note (Signed)
Clinical Social Work  CSW forwarded discharge summary and AVS to [facility] via [facility], as pt's anticipated discharge is Saturday. Weekend CSW will facilitate discharge to SNF on Saturday.   Dede Query, MSW, Theresia Majors [phone]

## 2012-04-04 NOTE — Progress Notes (Signed)
[facility] PROGRESS NOTE  PCCM Transfer 11/21  Andrea DRAKEFORD WUJ:[id number] DOB: May 07, 1928 DOA: 03/30/2012 PCP: [name], MD  Assessment/Plan:                                         Subarachnoid hemorrhage and intraparenchymal bleed: She was initially admitted for fall and an initial CT demonstrated a small subarachnoid hemorrhage. She was admitted to hospitalist service and Neuro surgery called. Dr Venetia Maxon recommended observation and frequent neuro checks, Early 11/19 patient was found to be incontinent with left sided weakness, stat CT head done showed an interval intraparenchymal hemorrhage within the right frontal parietal lobe of 6.8x 4 cm. Dr Venetia Maxon was reconsulted , recommended transfer to [facility] for observation for 2 days, and no surgical intervention needed at this time. He spoke to the [facility], reported that his mom's wishes were no heroic measures and DNR.  Started on a Dysphagia 3 diet now Gleevac on hold due to hemorrhage At this point needs Rehab Ambulate, PT/OT/Speech therapy Lethargic today will try to hold sedating meds, otherwise deficits unchanged Will need palliative care at SNF if she does not improve  Fall  Likely due to deconditioning. Continue physical therapy. Currently at [facility]. PT recommended SNF.   Hypertension  PRN hydralazine for BP > 180 , resume PO metoprolol  GERD  Continue PPI, change to PO  Depression/bipolar disorder  Stable. Continue medications.   Chronic myelocytic leukemia  Will hold Gleevac now esp due to rebleeding and worsening   Hypothyroidism  Continue Synthroid, change to PO  Hypokalemia  Replace as needed.   Prophylaxis  SCDs.   CSW following for placement,  SNF tomorrow if mentation improved Code status: DNR FAmily contact: Son, Ahleah Simko, d/w son 11/21 Disposition: SNF tomorrow     Consultants:  Neurosurgery. Dr.Stern  HPI/Subjective:  very sleepy but arousible today, no other  complaints , ate 30% meals  Objective: Filed Vitals:   04/04/12 0500 04/04/12 0538 04/04/12 1056 04/04/12 1412  BP:  137/59 120/89 156/85  Pulse:  60 66 76  Temp:  97.4 F (36.3 C) 98.6 F (37 C) 98.7 F (37.1 C)  TempSrc:  Oral Oral Oral  Resp:  20 20 20   Height:      Weight: 82.271 kg (181 lb 6 oz)     SpO2:  94% 96% 98%   No intake or output data in the 24 hours ending 04/04/12 1430 Filed Weights   04/01/12 0700 04/03/12 0500 04/04/12 0500  Weight: 77.4 kg (170 lb 10.2 oz) 82.4 kg (181 lb 10.5 oz) 82.271 kg (181 lb 6 oz)    Exam:   General:  Lethargic, arousible, answers questions  HEENT: pupils equal and reactive  Cardiovascular: s1s2  Respiratory: CTAB  Abdomen:soft NT ND BS+  Extemities; trace pedal edema  Neuro: LUE 0/5, LLE 1/5, Pt is a little drowsy but oriented to place , person and partly to time.   Data Reviewed: Basic Metabolic Panel:  Lab 04/04/12 9562 04/03/12 0521 04/02/12 0440 03/31/12 0442 03/30/12 2105  NA 135 142 142 138 139  K 3.4* 3.9 3.2* 3.5 3.3*  CL 103 109 109 105 103  CO2 25 23 26 25 26   GLUCOSE 137* 128* 136* 107* 122*  BUN 6 7 8 13 14   CREATININE 0.55 0.59 0.59 0.65 0.77  CALCIUM 8.6 8.8 8.7 9.3 9.2  MG --  1.9 -- 1.8 --  PHOS -- 2.3 -- -- --   Liver Function Tests: No results found for this basename: AST:5,ALT:5,ALKPHOS:5,BILITOT:5,PROT:5,ALBUMIN:5 in the last 168 hours No results found for this basename: LIPASE:5,AMYLASE:5 in the last 168 hours No results found for this basename: AMMONIA:5 in the last 168 hours CBC:  Lab 04/03/12 0521 04/02/12 0440 03/31/12 0442 03/30/12 2105  WBC 5.4 6.7 4.6 5.9  NEUTROABS -- -- -- 4.1  HGB 11.4* 11.7* 10.9* 11.4*  HCT 34.7* 35.0* 33.4* 34.5*  MCV 96.9 94.6 95.2 95.0  PLT 150 155 176 189   Cardiac Enzymes: No results found for this basename: CKTOTAL:5,CKMB:5,CKMBINDEX:5,TROPONINI:5 in the last 168 hours BNP (last 3 results) No results found for this basename: PROBNP:3 in the last  8760 hours CBG: No results found for this basename: GLUCAP:5 in the last 168 hours  Recent Results (from the past 240 hour(s))  MRSA PCR SCREENING     Status: Abnormal   Collection Time   04/01/12  7:15 AM      Component Value Range Status Comment   MRSA by PCR POSITIVE (*) NEGATIVE Final      Studies: No results found.  Scheduled Meds:    . antiseptic oral rinse  15 mL Mouth Rinse BID  . beta carotene w/minerals  1 tablet Oral Daily  . Chlorhexidine Gluconate Cloth  6 each Topical Q0600  . darifenacin  15 mg Oral Daily  . levothyroxine  125 mcg Oral QAC breakfast  . metoprolol tartrate  25 mg Oral BID  . multivitamin with minerals  1 tablet Oral q morning - 10a  . mupirocin ointment  1 application Nasal BID  . Oxcarbazepine  300 mg Oral Daily  . Oxcarbazepine  600 mg Oral QHS  . pantoprazole  40 mg Oral Q1200  . PARoxetine  30 mg Oral QHS   Continuous Infusions:    Principal Problem:  *Fall Active Problems:  Unspecified episodic mood disorder  Subarachnoid hemorrhage  Thyroid disease  HTN (hypertension)  GERD (gastroesophageal reflux disease)  Depression  Bipolar 1 disorder  CML (chronic myelocytic leukemia)  History of colon cancer  Anemia  Left-sided weakness  ICH (intracerebral hemorrhage)      [facility]  [facility] Pager [phone]. If 8PM-8AM, please contact night-coverage at [email or web address], password [facility] 04/04/2012, 2:30 PM  LOS: 5 days

## 2012-04-04 NOTE — Discharge Summary (Signed)
Physician Discharge Summary  Patient ID: [name] MRN: [id number] DOB/AGE: 76-Dec-1929 76 y.o.  Admit date: 03/30/2012 Discharge date: 04/05/2012  Primary Care Physician:  [name], MD  Disposition and Follow-up:  PCP in 1 week Palliative care services at SNF if continues to fail to thrive   Discharge Diagnoses:    Subarachnoid hemorrhage  Intraparenchymal bleed, R fronto-pareital hemorrhage  Left Hemiparesis  Fall  Unspecified episodic mood disorder  Thyroid disease  HTN (hypertension)  GERD (gastroesophageal reflux disease)  Depression  Bipolar 1 disorder  CML (chronic myelocytic leukemia)  History of colon cancer  Anemia  Left-sided weakness      Medication List     As of 04/04/2012  2:17 PM    STOP taking these medications         celecoxib 200 MG capsule   Commonly known as: CELEBREX      clonazePAM 0.5 MG tablet   Commonly known as: KLONOPIN      imatinib 100 MG tablet   Commonly known as: GLEEVEC      VITAMIN E (TOPICAL) Oil      TAKE these medications         acetaminophen 500 MG tablet   Commonly known as: TYLENOL   Take 1 tablet (500 mg total) by mouth every 6 (six) hours as needed. pain      beta carotene w/minerals tablet   Take 1 tablet by mouth daily.      ENABLEX 15 MG 24 hr tablet   Generic drug: darifenacin   Take 15 mg by mouth daily.      levothyroxine 137 MCG tablet   Commonly known as: SYNTHROID, LEVOTHROID   Take 1 tablet (137 mcg total) by mouth daily.      LORazepam 0.5 MG tablet   Commonly known as: ATIVAN   Take 1 tablet (0.5 mg total) by mouth every 6 (six) hours as needed (agitation).      metoprolol succinate 50 MG 24 hr tablet   Commonly known as: TOPROL-XL   Take 1 tablet (50 mg total) by mouth every morning.      multivitamin with minerals Tabs   Take 1 tablet by mouth every morning.      nitroGLYCERIN 0.3 MG SL tablet   Commonly known as: NITROSTAT   Place 1 tablet (0.3 mg total)  under the tongue every 5 (five) minutes as needed. Chest pain      omeprazole 20 MG capsule   Commonly known as: PRILOSEC   Take 1 capsule (20 mg total) by mouth daily as needed. For heartburn      Oxcarbazepine 300 MG tablet   Commonly known as: TRILEPTAL   Take 1-2 tablets (300-600 mg total) by mouth 2 (two) times daily. Take 1 tablet by mouth in the morning and 2 tablets by mouth at bedtime.      PARoxetine 30 MG tablet   Commonly known as: PAXIL   Take 1 tablet (30 mg total) by mouth at bedtime.        Consults:   Neurosurgery Dr.Stern Neurology Dr.Sethi   Significant Diagnostic Studies:   CT HEAD WITHOUT CONTRAST 11/20 IMPRESSION: Slight increase size right frontoparietal dilated intracerebral hemorrhage. Slight 5 mm right-to-left shift is mildly increased. Slight left peritentorial extension and subarachnoid blood, stable.    CT Head Wo Contrast [[id number]] :04/01/12  IMPRESSION: Interval 6.8 x 4.0 cm intraparenchymal hemorrhage within the right frontal parietal lobe. Local sulcal effacement however no midline shift. Underlying lesion  not excluded. Subarachnoid hemorrhage again noted.    CT Head Wo Contrast [[id number]] 11/17/13Few scattered foci of subarachnoid blood are seen at the IMPRESSION: Atrophy with small vessel chronic ischemic changes of deep cerebral white matter. Few scattered foci of subarachnoid hemorrhage are seen within sulci at hemispheres bilaterally with an additional small amount of blood at the free edge of the left tentorium, question subarachnoid versus minimal subdural blood.    Brief H and P: [name] is a 78 y.o. Caucasian female with history of recent subarachnoid hemorrhage after a fall, hypertension, hypothyroidism, GERD, depression and bipolar disorder, history of colon cancer, and CML who presents with the above complaints. Patient has had multiple falls recently and reports that currently she is at [facility] facility  getting rehabilitation. Today at 445 p.m. while she was going to her room, she reported using her walker but the door was heavy to push. She then had some increasing tremors on her right upper extremity. She lost balance and fell to her right side. She hit her head but did not lose consciousness. She presented to the emergency department for further evaluation. She had a head CT which showed few scattered foci of subarachnoid hemorrhage with small amount of blood at the free edge of the left tentorium, question subarachnoid versus minimal subdural blood. Neurosurgery, Dr. Venetia Maxon was called. Hospitalist service was asked to admit the patient for further care and management. Patient denies any recent fevers, chills, nausea, vomiting, chest pain, shortness of breath, abdominal pain, diarrhea, headaches or vision changes  Hospital Course:  Subarachnoid hemorrhage and intraparenchymal bleed:  She was initially admitted for fall and an initial CT demonstrated a small subarachnoid hemorrhage. She was admitted to hospitalist service and Neuro surgery called. Dr Venetia Maxon recommended observation and frequent neuro checks, Early 11/19 patient was found to be incontinent with left sided weakness, stat CT head done showed an interval intraparenchymal hemorrhage within the right frontal parietal lobe of 6.8x 4 cm. Dr Venetia Maxon was reconsulted , recommended transfer to [facility] for observation for 2 days, and no surgical intervention needed at the. He spoke to the [facility], reported that his mom's wishes were no heroic measures and DNR.  Started on a Dysphagia 3 diet now  Gleevac on hold due to hemorrhage  At this point needs Rehab  Continue  PT/OT/Speech therapy  Mentation tends to fluctuate, still has significant L sided deficits; if she declines overall then will need palliative care services at SNF  Fall  Likely due to deconditioning. Continue physical therapy. Currently at [facility]. PT recommended SNF.   Hypertension    BP stable now, resumed PO metoprolol   GERD  Continue PPI, change to PO   Depression/bipolar disorder  Stable. Continue medications.   Chronic myelocytic leukemia  Will hold Gleevac now esp due to rebleeding and worsening, if she were to recover significantly from her neurological condition and was a candidate for continued treatment of her CML then this can be restarted in the future  Hypothyroidism  Continue Synthroid   Discharge condition: stable with potential to decline Discharge diet: Dysphagia 3    Time spent on Discharge:  Signed: Shannette Tabares [facility] 04/04/2012, 2:17 PM

## 2012-04-05 ENCOUNTER — Other Ambulatory Visit: Payer: Self-pay

## 2012-04-05 MED ORDER — RISPERIDONE 0.25 MG PO TABS
0.2500 mg | ORAL_TABLET | Freq: Every day | ORAL | Status: DC
  Administered 2012-04-05: 0.25 mg via ORAL
  Filled 2012-04-05 (×3): qty 1

## 2012-04-05 MED ORDER — ACETAMINOPHEN 325 MG PO TABS
650.0000 mg | ORAL_TABLET | Freq: Four times a day (QID) | ORAL | Status: DC | PRN
  Administered 2012-04-05 – 2012-04-06 (×3): 650 mg via ORAL
  Filled 2012-04-05 (×3): qty 2

## 2012-04-05 NOTE — Progress Notes (Signed)
Physical Therapy Treatment Patient Details Name: [name] MRN: [id number] DOB: 1927/12/12 Today's Date: 04/05/2012 Time: 4098-1191 PT Time Calculation (min): 20 min  PT Assessment / Plan / Recommendation Comments on Treatment Session  Limited progress made with session today, will continue to work towards goals as pt is able.    Follow Up Recommendations  SNF;Supervision/Assistance - 24 hour           Equipment Recommendations  None recommended by PT;None recommended by OT    Recommendations for Other Services OT consult;Speech consult  Frequency Min 3X/week   Plan Discharge plan remains appropriate;Frequency remains appropriate    Precautions / Restrictions Precautions Precautions: Fall Restrictions Weight Bearing Restrictions: No       Mobility  Bed Mobility Bed Mobility: Rolling Right;Rolling Left;Left Sidelying to Sit;Sit to Supine;Scooting to [facility] Rolling Right: 1: +1 Total assist Rolling Right: Patient Percentage: 0% Rolling Left: 2: Max assist Rolling Left: Patient Percentage: 10% Left Sidelying to Sit: 2: Max assist Left Sidelying to Sit: Patient Percentage: 10% Sit to Supine: 1: +1 Total assist Sit to Supine: Patient Percentage: 0% Scooting to HOB: 1: +2 Total assist Scooting to [facility]: Patient Percentage: 0%      PT Goals Acute Rehab PT Goals PT Goal: Supine/Side to Sit - Progress: Progressing toward goal PT Goal: Sit at Edge Of Bed - Progress: Progressing toward goal PT Goal: Sit to Supine/Side - Progress: Progressing toward goal  Visit Information  Last PT Received On: 04/05/12 Assistance Needed: +2    Subjective Data  Subjective: Pt mumbling through out session, not desernable as to what she was saying.    Cognition  Overall Cognitive Status: Impaired Area of Impairment: Awareness of errors;Awareness of deficits;Following commands;Problem solving Arousal/Alertness: Other (comment) (eyes closed all of session, fidgety with left arm and  leg) Behavior During Session: Restless Following Commands: Follows one step commands inconsistently;Follows one step commands with increased time;Other (comment) (followed 1 step commands less than 10% with incr time) Safety/Judgement: Decreased awareness of safety precautions;Decreased safety judgement for tasks assessed;Decreased awareness of need for assistance Safety/Judgement - Other Comments: has left neglect, difficult to determine if pt is focued on task or not due to lethargy and pt not verbalizing coherently. Awareness of Errors: Assistance required to identify errors made;Assistance required to correct errors made    Balance  Static Sitting Balance Static Sitting - Balance Support: Feet supported Static Sitting - Level of Assistance: 2: Max assist Static Sitting - Comment/# of Minutes: Pt sat EOB for ~8 minutes with max to mod assist on left side. When assist decreased pt would have lt lateral/posterior loss of balance and no reactions to correct or catch balance noted. While seated worked on cervical ext with pt sustaining upright head postion for 5-10 seconds x2 with  supervision and max cues.  End of Session PT - End of Session Activity Tolerance: Other (comment) (Limited session due to pt's limited ability to participate) Patient left: in bed;with call bell/phone within reach;with bed alarm set;with nursing in room Nurse Communication: Mobility status;Need for lift equipment   GP     Sallyanne Kuster 04/05/2012, 1:55 PM  Sallyanne Kuster, PTA Office- [phone]

## 2012-04-05 NOTE — Progress Notes (Signed)
Called the patient's son Verlene Glantz and confirmed the goals of care meeting for tomorrow 11/24  at 9 am.

## 2012-04-05 NOTE — Progress Notes (Signed)
[facility] PROGRESS NOTE  PCCM Transfer 11/21  [name]:[id number] DOB: 1927-12-12 DOA: 03/30/2012 PCP: [name], MD  Assessment/Plan:                                         Subarachnoid hemorrhage and intraparenchymal bleed: She was initially admitted for fall and an initial CT demonstrated a small subarachnoid hemorrhage. She was admitted to hospitalist service and Neuro surgery called. Dr Venetia Maxon recommended observation and frequent neuro checks, Early 11/19 patient was found to be incontinent with left sided weakness, stat CT head done showed an interval intraparenchymal hemorrhage within the right frontal parietal lobe of 6.8x 4 cm. Dr Venetia Maxon was reconsulted , recommended transfer to [facility] for observation for 2 days, and no surgical intervention . He spoke to the [facility], reported that his mom's wishes were no heroic measures and DNR.  Started on a Dysphagia 3 diet now Gleevac on hold due to hemorrhage At this point needs Rehab Ambulate, PT/OT/Speech therapy Lethargic /encephalopathy secondary to SAH/Intraparenchymal bleed, and also gets agitated hence needs medications which in turn causes more lethargy , will start low dose risperdone at bedtime, and see if that helps with agitation which is most pronounced at night, and hopefully can limit medication during day time. I discussed situation with son and requested palliative consult for goals of care  Falls: Likely due to deconditioning/ recent SAH. Continue physical therapy. Currently at [facility]. PT recommended SNF.   Hypertension  PRN hydralazine for BP > 180 , resume PO metoprolol  GERD  Continue PPI, change to PO  Depression/bipolar disorder  Stable. Continue medications.   Chronic myelocytic leukemia  Will hold Gleevac now esp due to rebleeding and worsening   Hypothyroidism  Continue Synthroid, change to PO  Hypokalemia  Replace as needed.   Prophylaxis  SCDs.   Code status:  DNR FAmily contact: Son, Akiria Suderman, d/w son 11/23 Disposition: Will hold discharge, SNF when stable    Consultants:  Neurosurgery. Dr.Stern  HPI/Subjective:  very sleepy but arousible today, no other complaints , needs to be fed, total care, ate 30% meals Got ativan 0.5mg  at 4am today for agitation  Objective: Filed Vitals:   04/04/12 1850 04/04/12 2212 04/05/12 0418 04/05/12 0639  BP: 143/129 183/110  157/75  Pulse: 84 89  83  Temp: 98.9 F (37.2 C) 98.3 F (36.8 C)  99.5 F (37.5 C)  TempSrc: Oral Oral  Oral  Resp: 20 22  22   Height:      Weight:   80.876 kg (178 lb 4.8 oz) 78.427 kg (172 lb 14.4 oz)  SpO2: 98% 96%  96%   No intake or output data in the 24 hours ending 04/05/12 0958 Filed Weights   04/04/12 0500 04/05/12 0418 04/05/12 0639  Weight: 82.271 kg (181 lb 6 oz) 80.876 kg (178 lb 4.8 oz) 78.427 kg (172 lb 14.4 oz)    Exam:   General:  Lethargic, arousible, answers questions  HEENT: pupils equal and reactive  Cardiovascular: s1s2  Respiratory: CTAB  Abdomen:soft NT ND BS+  Extemities; trace pedal edema  Neuro: LUE 1/5, LLE 1/5, Pt is  drowsy but oriented to place , person and partly to time.   Data Reviewed: Basic Metabolic Panel:  Lab 04/04/12 8119 04/03/12 0521 04/02/12 0440 03/31/12 0442 03/30/12 2105  NA 135 142 142 138 139  K 3.4* 3.9  3.2* 3.5 3.3*  CL 103 109 109 105 103  CO2 25 23 26 25 26   GLUCOSE 137* 128* 136* 107* 122*  BUN 6 7 8 13 14   CREATININE 0.55 0.59 0.59 0.65 0.77  CALCIUM 8.6 8.8 8.7 9.3 9.2  MG -- 1.9 -- 1.8 --  PHOS -- 2.3 -- -- --   Liver Function Tests: No results found for this basename: AST:5,ALT:5,ALKPHOS:5,BILITOT:5,PROT:5,ALBUMIN:5 in the last 168 hours No results found for this basename: LIPASE:5,AMYLASE:5 in the last 168 hours No results found for this basename: AMMONIA:5 in the last 168 hours CBC:  Lab 04/03/12 0521 04/02/12 0440 03/31/12 0442 03/30/12 2105  WBC 5.4 6.7 4.6 5.9  NEUTROABS -- -- --  4.1  HGB 11.4* 11.7* 10.9* 11.4*  HCT 34.7* 35.0* 33.4* 34.5*  MCV 96.9 94.6 95.2 95.0  PLT 150 155 176 189   Cardiac Enzymes: No results found for this basename: CKTOTAL:5,CKMB:5,CKMBINDEX:5,TROPONINI:5 in the last 168 hours BNP (last 3 results) No results found for this basename: PROBNP:3 in the last 8760 hours CBG: No results found for this basename: GLUCAP:5 in the last 168 hours  Recent Results (from the past 240 hour(s))  MRSA PCR SCREENING     Status: Abnormal   Collection Time   04/01/12  7:15 AM      Component Value Range Status Comment   MRSA by PCR POSITIVE (*) NEGATIVE Final      Studies: No results found.  Scheduled Meds:    . antiseptic oral rinse  15 mL Mouth Rinse BID  . beta carotene w/minerals  1 tablet Oral Daily  . Chlorhexidine Gluconate Cloth  6 each Topical Q0600  . darifenacin  15 mg Oral Daily  . levothyroxine  125 mcg Oral QAC breakfast  . metoprolol tartrate  25 mg Oral BID  . multivitamin with minerals  1 tablet Oral q morning - 10a  . mupirocin ointment  1 application Nasal BID  . Oxcarbazepine  300 mg Oral Daily  . Oxcarbazepine  600 mg Oral QHS  . pantoprazole  40 mg Oral Q1200  . PARoxetine  30 mg Oral QHS   Continuous Infusions:    Principal Problem:  *Fall Active Problems:  Unspecified episodic mood disorder  Subarachnoid hemorrhage  Thyroid disease  HTN (hypertension)  GERD (gastroesophageal reflux disease)  Depression  Bipolar 1 disorder  CML (chronic myelocytic leukemia)  History of colon cancer  Anemia  Left-sided weakness  ICH (intracerebral hemorrhage)      [facility]  [facility] Pager [phone]. If 8PM-8AM, please contact night-coverage at [email or web address], password [facility] 04/05/2012, 9:58 AM  LOS: 6 days

## 2012-04-06 MED ORDER — BISACODYL 10 MG RE SUPP: 10.0000 mg | Freq: Every day | RECTAL | Status: DC | PRN

## 2012-04-06 NOTE — Progress Notes (Signed)
Benedetto Coons, NP assessed patient

## 2012-04-06 NOTE — Progress Notes (Signed)
Rn completed neuro check on patient. Patient had been much more lethargic and not following commands. Patient would pull right extremities and move to painful stimuli. She had previously taken medicine PO and would squeeze right hand/wiggle toes to command. Patient had been given a risperdal before bedtime. Rapid Response RN called for second opinion. Stated that she believed the lethargy was due to the medication, and patient was okay to rest. Pt is DNR. Lenny Pastel, NP also notified.

## 2012-04-06 NOTE — Progress Notes (Signed)
[facility] progress note. Chief complaint. Decreased level of consciousness. History of present illness. This 78 year old female in hospital with falls and resultant subarachnoid and intraparenchymal bleeds. The patient had been seen by neurosurgery the family reports mother's wishes were for no heroic measures and DO NOT RESUSCITATE status. The patient had been having agitation overnight and Risperdal was started with first dose today. Nursing reports patient to was previously alert and responsive but now lethargic. Rapid response came to the bedside and felt that this was likely secondary to dosing of Risperdal. I also came to the bedside to evaluate the patient for myself. I find the patient hyper somnolent though does respond to sternal rub. She moves the right arm with painful stimuli and both legs left greater than right. Vital signs. Temperature 98.2, pulse 60, respiration 20, blood pressure 110/56. O2 sats 94%. General appearance. Frail appearing elderly female. She is hypersomnolent and responds only to painful stimuli. Neurologic. Again the patient to response to painful stimuli. She will not open her eyes independently. She moves her right arm to painful stimuli in the left arm is flaccid. She moves both legs but the left greater than the right. Impression/plan. Problem #1 altered mental status. Patient has been having waxing and waning level of consciousness throughout hospitalization per prior documentation. I agree with rapid response and suspect current lethargy do to Risperdal dosing. Given the family wishes for no heroic measures and DO NOT RESUSCITATE status I do not see any benefit in repeating a CT scan at this point. Other than lethargy I find no new focal defects. If patient remains lethargic later this a.m. may wish to discontinue Risperdal dosing but will defer to the rounding physician.

## 2012-04-06 NOTE — Progress Notes (Signed)
[facility] PROGRESS NOTE  PCCM Transfer 11/21  [name] ZOX:[id number] DOB: Apr 09, 1928 DOA: 03/30/2012 PCP: [name], MD  Assessment/Plan:                                         Subarachnoid hemorrhage and intraparenchymal bleed: She was initially admitted for fall and an initial CT demonstrated a small subarachnoid hemorrhage. She was admitted to hospitalist service and Neuro surgery called. Dr Venetia Maxon recommended observation and frequent neuro checks, Early 11/19 patient was found to be incontinent with left sided weakness, stat CT head done showed an interval intraparenchymal hemorrhage within the right frontal parietal lobe of 6.8x 4 cm. Dr Venetia Maxon was reconsulted , recommended transfer to [facility] for observation for 2 days, and no surgical intervention . He spoke to the [facility], reported that his mom's wishes were no heroic measures and DNR.  Started on a Dysphagia 3 diet now Gleevac on hold due to hemorrhage At this point needs Rehab Ambulate, PT/OT/Speech therapy Lethargic /encephalopathy secondary to SAH/Intraparenchymal bleed, and also gets agitated hence needs medications which in turn causes more lethargy , was very lethargic after Risperdal last pm hence will DC dose. Prognosis is poor, for goals of care meeting per Dr.Lama today  Falls: Likely due to deconditioning/ recent SAH. Continue physical therapy. Currently at [facility]. PT recommended SNF.   Hypertension  PRN hydralazine for BP > 180 , resumed PO metoprolol  GERD  Continue PPI, change to PO  Depression/bipolar disorder  Stable. Continue medications.   Chronic myelocytic leukemia  Will hold Gleevac now esp due to rebleeding and worsening   Hypothyroidism  Continue Synthroid, change to PO  Hypokalemia  Replace as needed.   Prophylaxis  SCDs.   Code status: DNR FAmily contact: Son, Storie Heffern, d/w son 11/23 Disposition:    Consultants:  Neurosurgery.  Dr.Stern  HPI/Subjective: Very lethargic after risperdone last pm, arousible this am, very poor Po intake  Objective: Filed Vitals:   04/05/12 2158 04/05/12 2222 04/06/12 0111 04/06/12 0536  BP: 192/124 148/60 110/56 115/53  Pulse: 97  60 58  Temp: 99.3 F (37.4 C)  98.2 F (36.8 C) 97.4 F (36.3 C)  TempSrc: Oral  Axillary Axillary  Resp: 18  20 20   Height:      Weight:    79.5 kg (175 lb 4.3 oz)  SpO2: 95%  94% 100%    Intake/Output Summary (Last 24 hours) at 04/06/12 0929 Last data filed at 04/05/12 1200  Gross per 24 hour  Intake    120 ml  Output      0 ml  Net    120 ml   Filed Weights   04/05/12 0418 04/05/12 0639 04/06/12 0536  Weight: 80.876 kg (178 lb 4.8 oz) 78.427 kg (172 lb 14.4 oz) 79.5 kg (175 lb 4.3 oz)    Exam:   General:  Lethargic, arousible, answers questions, follows some commands  HEENT: pupils equal and reactive  Cardiovascular: s1s2  Respiratory: CTAB  Abdomen:soft NT ND BS+  Extemities; trace pedal edema  Neuro: LUE 1/5, LLE 1/5  Data Reviewed: Basic Metabolic Panel:  Lab 04/04/12 8119 04/03/12 0521 04/02/12 0440 03/31/12 0442 03/30/12 2105  NA 135 142 142 138 139  K 3.4* 3.9 3.2* 3.5 3.3*  CL 103 109 109 105 103  CO2 25 23 26 25 26   GLUCOSE 137* 128* 136* 107*  122*  BUN 6 7 8 13 14   CREATININE 0.55 0.59 0.59 0.65 0.77  CALCIUM 8.6 8.8 8.7 9.3 9.2  MG -- 1.9 -- 1.8 --  PHOS -- 2.3 -- -- --   Liver Function Tests: No results found for this basename: AST:5,ALT:5,ALKPHOS:5,BILITOT:5,PROT:5,ALBUMIN:5 in the last 168 hours No results found for this basename: LIPASE:5,AMYLASE:5 in the last 168 hours No results found for this basename: AMMONIA:5 in the last 168 hours CBC:  Lab 04/03/12 0521 04/02/12 0440 03/31/12 0442 03/30/12 2105  WBC 5.4 6.7 4.6 5.9  NEUTROABS -- -- -- 4.1  HGB 11.4* 11.7* 10.9* 11.4*  HCT 34.7* 35.0* 33.4* 34.5*  MCV 96.9 94.6 95.2 95.0  PLT 150 155 176 189   Cardiac Enzymes: No results found for  this basename: CKTOTAL:5,CKMB:5,CKMBINDEX:5,TROPONINI:5 in the last 168 hours BNP (last 3 results) No results found for this basename: PROBNP:3 in the last 8760 hours CBG: No results found for this basename: GLUCAP:5 in the last 168 hours  Recent Results (from the past 240 hour(s))  MRSA PCR SCREENING     Status: Abnormal   Collection Time   04/01/12  7:15 AM      Component Value Range Status Comment   MRSA by PCR POSITIVE (*) NEGATIVE Final      Studies: No results found.  Scheduled Meds:    . antiseptic oral rinse  15 mL Mouth Rinse BID  . beta carotene w/minerals  1 tablet Oral Daily  . Chlorhexidine Gluconate Cloth  6 each Topical Q0600  . darifenacin  15 mg Oral Daily  . levothyroxine  125 mcg Oral QAC breakfast  . metoprolol tartrate  25 mg Oral BID  . multivitamin with minerals  1 tablet Oral q morning - 10a  . mupirocin ointment  1 application Nasal BID  . Oxcarbazepine  300 mg Oral Daily  . Oxcarbazepine  600 mg Oral QHS  . pantoprazole  40 mg Oral Q1200  . PARoxetine  30 mg Oral QHS  . [DISCONTINUED] risperiDONE  0.25 mg Oral QHS   Continuous Infusions:    Principal Problem:  *Fall Active Problems:  Unspecified episodic mood disorder  Subarachnoid hemorrhage  Thyroid disease  HTN (hypertension)  GERD (gastroesophageal reflux disease)  Depression  Bipolar 1 disorder  CML (chronic myelocytic leukemia)  History of colon cancer  Anemia  Left-sided weakness  ICH (intracerebral hemorrhage)      [facility]  [facility] Pager [phone]. If 8PM-8AM, please contact night-coverage at [email or web address], password [facility] 04/06/2012, 9:29 AM  LOS: 7 days

## 2012-04-06 NOTE — Consult Note (Signed)
Patient [name] [name]      DOB: March 12, 1928      VWU:[id number]     Consult Note from the Palliative Medicine Team at [facility]    Consult Requested by: Zannie Cove     PCP: [name], MD Reason for Consultation: goals of care     Phone [phone]  Assessment of patients Current state: 78 year old female with recent history of subarachnoid hemorrhage after fall came to the hospital of she had another fall at the assisted living facility CT scan of the head showed cerebral bleed, subarachnoid hemorrhage. At this time meeting was done with patient's son and he wants his mother to be DO NOT RESUSCITATE with no aggressive interventions. They do not want any artificial nutrition . MOST  form was filled, patient to go to skilled nursing facility with palliative care followup  Goals of Care: 1.  Code Status: DO NOT RESUSCITATE   2. Scope of Treatment: 1. Vital Signs: Per protocol 2. Respiratory/Oxygen:  nasal cannula 3. Nutritional Support/Tube Feeds: No PEG tube or NG tube feedings 4. Antibiotics: As needed 5. Blood Products: as needed 6. IVF: as needed 7. Review of Medications to be discontinued: None 8. Labs: as needed   4. Disposition:SNF with palliative care follow up   3. Symptom Management:   1. Anxiety/Agitation: Ativan when necessary 2. Bowel Regimen: Dulcolax suppository when necessary 3. Fever: Tylenol when necessary 4. Nausea/Vomiting: Zofran when necessary  4. Psychosocial: Patient was taking at home 3 months ago when she started having falls and had to be moved to assisted living. She had another fall there and at this time family understands that she will require skilled care. Patient was a functional before this happened and some wants to focus on the quality of life which is not good with the same anymore.  5. Spiritual: No spiritual needs at this time         Patient Documents Completed or Given: Document Given Completed    Advanced Directives Pkt    MOST    DNR    Gone from My Sight    Hard Choices      Brief HPI: 78 year old female with a recent history of subarachnoid hemorrhage after fall, hypertension hypothyroidism, bipolar disorder, history of colon cancer who was brought to the hospital after she had a fall at the [facility] assisted living facility. CT of the head again showed cerebral bleed, subarachnoid hemorrhage so patient was admitted for further evaluation. At this time patient is found not to be a surgical candidate and is managed medically. She does have neurological deficit with the cerebral bleed. At this time she is somnolent and unable to participate in the goals of care meeting. Patient did have a fair by mouth intake over the past 2 days, goals of care meeting was done with the patient's son who is the healthcare power of attorney. Discussed the CODE STATUS, artificial nutrition hydration, poor prognosis, quality of life issues, comfort care approach, disposition options.  ROS: Unobtainable as patient is somnolent and confused   PMH:  Past Medical History  Diagnosis Date  . Thyroid disease   . HTN (hypertension)   . GERD (gastroesophageal reflux disease)   . Depression   . Bipolar 1 disorder   . Cancer     colon ca  . CML (chronic myelocytic leukemia)      PSH: Past Surgical History  Procedure Date  . Colon surgery   . Abdominal hysterectomy   . Cholecystectomy   .  Total hip arthroplasty     bilateral   I have reviewed the FH and SH and  If appropriate update it with new information. Allergies  Allergen Reactions  . Stelazine (Trifluoperazine)     "I wanted to climb the walls."   Scheduled Meds:   . antiseptic oral rinse  15 mL Mouth Rinse BID  . beta carotene w/minerals  1 tablet Oral Daily  . Chlorhexidine Gluconate Cloth  6 each Topical Q0600  . darifenacin  15 mg Oral Daily  . levothyroxine  125 mcg Oral QAC breakfast  . metoprolol tartrate  25 mg Oral BID   . multivitamin with minerals  1 tablet Oral q morning - 10a  . [EXPIRED] mupirocin ointment  1 application Nasal BID  . Oxcarbazepine  300 mg Oral Daily  . Oxcarbazepine  600 mg Oral QHS  . pantoprazole  40 mg Oral Q1200  . PARoxetine  30 mg Oral QHS  . [DISCONTINUED] risperiDONE  0.25 mg Oral QHS   Continuous Infusions:  PRN Meds:.acetaminophen, hydrALAZINE, LORazepam, nitroGLYCERIN, ondansetron (ZOFRAN) IV, [DISCONTINUED] acetaminophen    BP 115/53  Pulse 58  Temp 97.4 F (36.3 C) (Axillary)  Resp 20  Ht 5\' 8"  (1.727 m)  Wt 79.5 kg (175 lb 4.3 oz)  BMI 26.65 kg/m2  SpO2 100%   PPS: 30%   Intake/Output Summary (Last 24 hours) at 04/06/12 1038 Last data filed at 04/05/12 1200  Gross per 24 hour  Intake    120 ml  Output      0 ml  Net    120 ml     Physical Exam:  General: Somnolent HEENT:  Atraumatic normocephalic oral mucosa mildly dry Chest:   clear to auscultation bilaterally CVS: S1-S2 is regular in rate and rhythm Abdomen: soft nontender Ext:  no edema Neuro: somnolent though rousable, does not have decision-making capacity this time  Labs: CBC    Component Value Date/Time   WBC 5.4 04/03/2012 0521   WBC 5.6 01/18/2012 1419   WBC 4.0 10/19/2009 1508   RBC 3.58* 04/03/2012 0521   RBC 3.92 01/18/2012 1419   HGB 11.4* 04/03/2012 0521   HGB 12.7 01/18/2012 1419   HGB 12.6 10/19/2009 1508   HCT 34.7* 04/03/2012 0521   HCT 38.1 01/18/2012 1419   HCT 36.9 10/19/2009 1508   PLT 150 04/03/2012 0521   PLT 228 01/18/2012 1419   PLT 238 10/19/2009 1508   MCV 96.9 04/03/2012 0521   MCV 97.2 01/18/2012 1419   MCV 96 10/19/2009 1508   MCH 31.8 04/03/2012 0521   MCH 32.5 01/18/2012 1419   MCH 32.8 10/19/2009 1508   MCHC 32.9 04/03/2012 0521   MCHC 33.4 01/18/2012 1419   MCHC 34.1 10/19/2009 1508   RDW 14.3 04/03/2012 0521   RDW 13.8 01/18/2012 1419   RDW 11.7 10/19/2009 1508   LYMPHSABS 1.2 03/30/2012 2105   LYMPHSABS 1.0 01/18/2012 1419   LYMPHSABS 1.1 10/19/2009 1508   MONOABS 0.6  03/30/2012 2105   MONOABS 0.5 01/18/2012 1419   EOSABS 0.1 03/30/2012 2105   EOSABS 0.1 01/18/2012 1419   EOSABS 0.1 10/19/2009 1508   BASOSABS 0.0 03/30/2012 2105   BASOSABS 0.1 01/18/2012 1419   BASOSABS 0.0 10/19/2009 1508    BMET    Component Value Date/Time   NA 135 04/04/2012 0610   NA 140 01/18/2012 1419   K 3.4* 04/04/2012 0610   K 3.9 01/18/2012 1419   CL 103 04/04/2012 0610   CL 107 01/18/2012  1419   CO2 25 04/04/2012 0610   CO2 23 01/18/2012 1419   GLUCOSE 137* 04/04/2012 0610   GLUCOSE 126* 01/18/2012 1419   BUN 6 04/04/2012 0610   BUN 25.0 01/18/2012 1419   CREATININE 0.55 04/04/2012 0610   CREATININE 0.7 01/18/2012 1419   CALCIUM 8.6 04/04/2012 0610   CALCIUM 9.3 01/18/2012 1419   GFRNONAA 85* 04/04/2012 0610   GFRAA >90 04/04/2012 0610    CMP     Component Value Date/Time   NA 135 04/04/2012 0610   NA 140 01/18/2012 1419   K 3.4* 04/04/2012 0610   K 3.9 01/18/2012 1419   CL 103 04/04/2012 0610   CL 107 01/18/2012 1419   CO2 25 04/04/2012 0610   CO2 23 01/18/2012 1419   GLUCOSE 137* 04/04/2012 0610   GLUCOSE 126* 01/18/2012 1419   BUN 6 04/04/2012 0610   BUN 25.0 01/18/2012 1419   CREATININE 0.55 04/04/2012 0610   CREATININE 0.7 01/18/2012 1419   CALCIUM 8.6 04/04/2012 0610   CALCIUM 9.3 01/18/2012 1419   PROT 6.9 03/03/2012 1610   PROT 6.8 01/18/2012 1419   ALBUMIN 3.6 03/03/2012 1610   ALBUMIN 3.9 01/18/2012 1419   AST 25 03/03/2012 1610   AST 25 01/18/2012 1419   ALT 14 03/03/2012 1610   ALT 20 01/18/2012 1419   ALKPHOS 79 03/03/2012 1610   ALKPHOS 88 01/18/2012 1419   BILITOT 0.2* 03/03/2012 1610   BILITOT 0.40 01/18/2012 1419   GFRNONAA 85* 04/04/2012 0610   GFRAA >90 04/04/2012 0610      CT scan of the Head Reviewed/Impressions: Slight increase size right frontoparietal dilated intracerebral  hemorrhage. Slight 5 mm right-to-left shift is mildly increased.  Slight left peritentorial extension and subarachnoid blood, stable.    Time In Time Out Total Time Spent with Patient  Total Overall Time   9 AM   10:30 AM   60 minutes 90 minutes     Greater than 50%  of this time was spent counseling and coordinating care related to the above assessment and plan.

## 2012-04-07 DIAGNOSIS — I609 Nontraumatic subarachnoid hemorrhage, unspecified: Secondary | ICD-10-CM

## 2012-04-07 MED ORDER — LORAZEPAM 1 MG PO TABS: 1.0000 mg | ORAL_TABLET | Freq: Four times a day (QID) | ORAL | Status: AC | PRN

## 2012-04-07 MED ORDER — BISACODYL 10 MG RE SUPP: 10.0000 mg | RECTAL | Status: AC | PRN

## 2012-04-07 NOTE — Progress Notes (Signed)
SNF bed available at [facility] for d/c today- Son aware and agreeable to plans- requesting EMS transport.   Reece Levy, MSW, Theresia Majors [phone]

## 2012-04-07 NOTE — Progress Notes (Signed)
Brief Nutrition Note  Chart reviewed. Pt now transitioning to comfort care. Plan for d/c to SNF with Hospice.  No further nutrition interventions warranted at this time.  Please re-consult as needed.   Kendell Bane RD, LDN, CNSC [phone] Pager [phone] After Hours Pager

## 2012-04-07 NOTE — Progress Notes (Signed)
Occupational Therapy Treatment Patient Details Name: [name] MRN: [id number] DOB: Mar 23, 1928 Today's Date: 04/07/2012 Time: 4098-1191 OT Time Calculation (min): 24 min  OT Assessment / Plan / Recommendation Comments on Treatment Session This 78 yo making minmal progress, but progress non the less. Pt both times she has been seen by OT she has been lethargic.    Follow Up Recommendations  SNF       Equipment Recommendations  None recommended by PT;None recommended by OT       Frequency Min 3X/week   Plan Discharge plan remains appropriate    Precautions / Restrictions Precautions Precautions: Fall Restrictions Weight Bearing Restrictions: Yes Other Position/Activity Restrictions: I finger sublux at shoulder, need to make sure to align before any weight bearing on it.       ADL  Grooming: Performed;Wash/dry face;Supervision/safety (with total A for sitting balanc, using RUE) Where Assessed - Grooming: Unsupported sitting ADL Comments: Worked on EOB sitting balance this session with pt holding her head up (raised her head up from totally flexed x2 without A) and turning head to left (unable to do on her own, said she was, but her head was not moving). Pt moving her right side (arm, trunk, leg) spontaneously), but not able to maintian sitting balance.       OT Goals ADL Goals ADL Goal: Grooming - Progress: Progressing toward goals Miscellaneous OT Goals OT Goal: Miscellaneous Goal #3 - Progress: Progressing toward goals OT Goal: Miscellaneous Goal #4 - Progress: Progressing toward goals  Visit Information  Last OT Received On: 04/07/12 Assistance Needed: +2    Subjective Data  Subjective: "what" ( when I said her name several times)      Cognition  Overall Cognitive Status: Impaired Area of Impairment: Attention;Awareness of deficits;Following commands Arousal/Alertness: Lethargic Orientation Level: Disoriented to;Situation (oriented to name and  place) Behavior During Session: Lethargic Current Attention Level: Sustained Following Commands: Follows one step commands inconsistently;Follows one step commands with increased time (followed one step commands 20% of time)) Awareness of Deficits: Pt unaware why she is here and that her left side does not work    Mobility   Bed Mobility Bed Mobility: Supine to Sit;Sitting - Scoot to Edge of Bed;Sit to Supine Supine to Sit: 1: +2 Total assist;HOB elevated (all the way up) Supine to Sit: Patient Percentage: 0% Sitting - Scoot to Edge of Bed: 1: +2 Total assist Sitting - Scoot to Edge of Bed: Patient Percentage: 0% Sit to Supine: 1: +2 Total assist Sit to Supine: Patient Percentage: 0%          Balance Static Sitting Balance Static Sitting - Balance Support: Feet supported Static Sitting - Level of Assistance: 1: +1 Total assist Static Sitting - Comment/# of Minutes: For 12 minutes Dynamic Sitting Balance Dynamic Sitting - Balance Support: Feet supported Dynamic Sitting - Level of Assistance: 1: +1 Total assist   End of Session OT - End of Session Activity Tolerance: Patient limited by fatigue Patient left: in bed;with bed alarm set       Evette Georges [phone] 04/07/2012, 11:21 AM

## 2012-04-07 NOTE — Discharge Summary (Signed)
Physician Discharge Summary  Patient ID: CARLIE SOLORZANO MRN: [id number] DOB/AGE: 1928/01/17 78 y.o.  Admit date: 03/30/2012 Discharge date: 04/05/2012  Primary Care Physician:  [name], MD  Disposition and Follow-up:  Palliative care services at SNF   Discharge Diagnoses:    Subarachnoid hemorrhage  Intraparenchymal bleed, R fronto-pareital hemorrhage  Left Hemiparesis  Fall  Unspecified episodic mood disorder  Thyroid disease  HTN (hypertension)  GERD (gastroesophageal reflux disease)  Depression  Bipolar 1 disorder  CML (chronic myelocytic leukemia)  History of colon cancer  Anemia  Left-sided weakness      Medication List     As of 04/07/2012 11:12 AM    STOP taking these medications         celecoxib 200 MG capsule   Commonly known as: CELEBREX      clonazePAM 0.5 MG tablet   Commonly known as: KLONOPIN      imatinib 100 MG tablet   Commonly known as: GLEEVEC      VITAMIN E (TOPICAL) Oil      TAKE these medications         acetaminophen 500 MG tablet   Commonly known as: TYLENOL   Take 1 tablet (500 mg total) by mouth every 6 (six) hours as needed. pain      beta carotene w/minerals tablet   Take 1 tablet by mouth daily.      bisacodyl 10 MG suppository   Commonly known as: DULCOLAX   Place 1 suppository (10 mg total) rectally as needed for constipation.      ENABLEX 15 MG 24 hr tablet   Generic drug: darifenacin   Take 15 mg by mouth daily.      levothyroxine 137 MCG tablet   Commonly known as: SYNTHROID, LEVOTHROID   Take 1 tablet (137 mcg total) by mouth daily.      LORazepam 1 MG tablet   Commonly known as: ATIVAN   Take 1 tablet (1 mg total) by mouth every 6 (six) hours as needed (agitation).      metoprolol succinate 50 MG 24 hr tablet   Commonly known as: TOPROL-XL   Take 1 tablet (50 mg total) by mouth every morning.      multivitamin with minerals Tabs   Take 1 tablet by mouth every morning.     nitroGLYCERIN 0.3 MG SL tablet   Commonly known as: NITROSTAT   Place 1 tablet (0.3 mg total) under the tongue every 5 (five) minutes as needed. Chest pain      omeprazole 20 MG capsule   Commonly known as: PRILOSEC   Take 1 capsule (20 mg total) by mouth daily as needed. For heartburn      Oxcarbazepine 300 MG tablet   Commonly known as: TRILEPTAL   Take 1-2 tablets (300-600 mg total) by mouth 2 (two) times daily. Take 1 tablet by mouth in the morning and 2 tablets by mouth at bedtime.      PARoxetine 30 MG tablet   Commonly known as: PAXIL   Take 1 tablet (30 mg total) by mouth at bedtime.         Consults:   Neurosurgery Dr.Stern Neurology Dr.Sethi   Significant Diagnostic Studies:   CT HEAD WITHOUT CONTRAST 11/20 IMPRESSION: Slight increase size right frontoparietal dilated intracerebral hemorrhage. Slight 5 mm right-to-left shift is mildly increased. Slight left peritentorial extension and subarachnoid blood, stable.    CT Head Wo Contrast [[id number]] :04/01/12  IMPRESSION: Interval 6.8 x 4.0 cm intraparenchymal  hemorrhage within the right frontal parietal lobe. Local sulcal effacement however no midline shift. Underlying lesion not excluded. Subarachnoid hemorrhage again noted.    CT Head Wo Contrast [[id number]] 11/17/13Few scattered foci of subarachnoid blood are seen at the IMPRESSION: Atrophy with small vessel chronic ischemic changes of deep cerebral white matter. Few scattered foci of subarachnoid hemorrhage are seen within sulci at hemispheres bilaterally with an additional small amount of blood at the free edge of the left tentorium, question subarachnoid versus minimal subdural blood.    Brief H and P: [name] is a 78 y.o. Caucasian female with history of recent subarachnoid hemorrhage after a fall, hypertension, hypothyroidism, GERD, depression and bipolar disorder, history of colon cancer, and CML who presents with the above complaints.  Patient has had multiple falls recently and reports that currently she is at [facility] facility getting rehabilitation. Today at 445 p.m. while she was going to her room, she reported using her walker but the door was heavy to push. She then had some increasing tremors on her right upper extremity. She lost balance and fell to her right side. She hit her head but did not lose consciousness. She presented to the emergency department for further evaluation. She had a head CT which showed few scattered foci of subarachnoid hemorrhage with small amount of blood at the free edge of the left tentorium, question subarachnoid versus minimal subdural blood. Neurosurgery, Dr. Venetia Maxon was called. Hospitalist service was asked to admit the patient for further care and management. Patient denies any recent fevers, chills, nausea, vomiting, chest pain, shortness of breath, abdominal pain, diarrhea, headaches or vision changes  Hospital Course:  Subarachnoid hemorrhage and intraparenchymal bleed:  She was initially admitted for fall and an initial CT demonstrated a small subarachnoid hemorrhage. She was admitted to hospitalist service and Neuro surgery called. Dr Venetia Maxon recommended observation and frequent neuro checks, Early 11/19 patient was found to be incontinent with left sided weakness, stat CT head done showed an interval intraparenchymal hemorrhage within the right frontal parietal lobe of 6.8x 4 cm. Dr Venetia Maxon was reconsulted , recommended transfer to [facility] for observation for 2 days, and no surgical intervention needed at the. He spoke to the [facility], reported that his mom's wishes were no heroic measures and DNR.  Started on a Dysphagia 3 diet now  Gleevac on hold due to hemorrhage  At this point needs Rehab  Continue  PT/OT/Speech therapy  Mentation has declined and tends to fluctuate, still has significant L sided deficits; had a palliative care meeting for goals of care and decision made to focus on comfort,  she will need palliative care services at SNF, MOST form filled  Fall  Likely due to deconditioning and subarachnoid hemorrhage   Hypertension  BP stable now, resumed PO metoprolol   GERD  Continue PPI, change to PO   Depression/bipolar disorder  Stable. Continue medications.   Chronic myelocytic leukemia  Will hold Gleevac now esp due to rebleeding and worsening  Hypothyroidism  Continue Synthroid   Discharge condition: guarded Discharge diet: Dysphagia 3, comfort feeds    Time spent on Discharge:  Signed: Shaila Gilchrest [facility] 04/07/2012, 11:12 AM

## 2012-04-07 NOTE — Progress Notes (Signed)
Repot called to the facility, patient ready for discharged. Assessments remained unchanged prior to discharge.

## 2012-04-15 ENCOUNTER — Ambulatory Visit (HOSPITAL_COMMUNITY): Payer: Self-pay | Admitting: Psychiatry

## 2012-04-17 ENCOUNTER — Telehealth: Payer: Self-pay | Admitting: Oncology

## 2012-04-17 NOTE — Telephone Encounter (Signed)
pt son Nedra Hai called to cx 12/6 lb appt due to pt in rehab ([facility]) and has not progressed enough to travel - message forwarded to desk nurse. Son can be reached @ [phone].

## 2012-04-18 ENCOUNTER — Other Ambulatory Visit: Payer: Self-pay

## 2012-04-18 ENCOUNTER — Telehealth: Payer: Self-pay | Admitting: *Deleted

## 2012-04-18 NOTE — Telephone Encounter (Signed)
Not able to make her appointment. Still not able to get up without use of hoyer lift. Recent subarachnoid hemorrhage and is currently at [facility] for rehab. Wanted Dr. Truett Perna aware that she has not been on her Gleevec for at least 2 1/2 weeks and wanted to be sure he was aware of this. Dr. Pete Glatter if following her in the nursing home. If MD wants to resume the Gleevec, please call [phone]. Forwarded to MD.

## 2012-04-23 ENCOUNTER — Other Ambulatory Visit: Payer: Self-pay | Admitting: *Deleted

## 2012-04-23 MED ORDER — IMATINIB MESYLATE 100 MG PO TABS: 300.0000 mg | ORAL_TABLET | Freq: Every day | ORAL | Status: DC

## 2012-04-23 NOTE — Telephone Encounter (Signed)
Per Dr. Truett Perna: Pt needs to resume Gleevec. Spoke with Pattricia Boss at [facility], she stated pt has not had CBC since being released from hospital. Will need rx and order for labs faxed over to [facility] at [phone]. Faxed order for CBC to facility. Will fax rx when Dr. Truett Perna is in office 04/24/12.

## 2012-04-24 ENCOUNTER — Telehealth: Payer: Self-pay | Admitting: *Deleted

## 2012-04-24 NOTE — Telephone Encounter (Signed)
Message from Delmar at [facility] re[facility] facility reporting they had a care planning meeting today and pt's family has decided against Gleevec therapy and are considering hospice.  Called pt's son Chesnee Floren, he stated during the meeting they told him how much his mom had declined. She has had "zero response to PT" and is no longer a candidate for rehab. They are transferring her to the "restorative care" wing at [facility]. He made the decision to discontinue Gleevec so hospice could be brought in. He states pt is not able to be transported in to the office to see Dr. Truett Perna.  Dr. Truett Perna made aware.

## 2012-04-28 ENCOUNTER — Encounter: Payer: Self-pay | Admitting: Nurse Practitioner

## 2012-04-28 NOTE — Progress Notes (Signed)
Received notice from Dr. Laverle Hobby office- pt declined Gleevec.  Requesting Hospice services per primary MD.  Dr. Truett Perna aware.

## 2012-04-30 ENCOUNTER — Telehealth: Payer: Self-pay | Admitting: *Deleted

## 2012-04-30 NOTE — Telephone Encounter (Signed)
Received message from pt's son requesting return call.  Called and spoke with [facility] he is "confused, in over my head" about his mother's care.  Reports that [facility] had meeting with "all professionals there, except Dr. Pete Glatter" and they stated that pt has made no progression in therapy and is declining.  Pt had stroke 03/31/12, was in hospital for approx 2 weeks then went to rehab.  Hospital took her off Gleevec and she has not re-started this med.   Reports that Medicare is denying coverage and they are on private pay now and family needs hospice referral.  Unable to obtain Hospice referral per son.   This RN spoke with Dr. Truett Perna and made him aware of situation. Called pt's son back; per Dr. Truett Perna-- pt CML in remission and from oncology standpoint pt is OK.  Dr. Truett Perna states he is available to discuss pt care with Dr. Pete Glatter.  Mr. Pesnell made aware that hospice referral would need to be addressed by Dr. Pete Glatter. Mr. Mcauley verbalized understanding and expressed appreciation for call back.

## 2012-05-06 IMAGING — CT CT ABD-PELV W/ CM
2 of 5 series · 16 of 46 positions shown, 18 images · IV contrast (omniscan)
Comparison: 11/18/2008

CLINICAL DATA: Colon cancer, restaging.  Ongoing chemotherapy.
Prior appendectomy, cholecystectomy, hysterectomy.  Chronic
myelocytic leukemia diagnosed 4224.

CT ABDOMEN AND PELVIS WITH CONTRAST
TECHNIQUE: Multidetector CT imaging of the abdomen and pelvis was
performed following the standard protocol during bolus
administration of intravenous contrast.
Contrast: 100 ml Omniscan 300 IV contrast

[Series 2: rtn a/p with · axial · 0.72mm/px · z∈[-426,-76]mm · 13 of 80 slices shown, 15 images]
[im 5/80  soft-tissue]
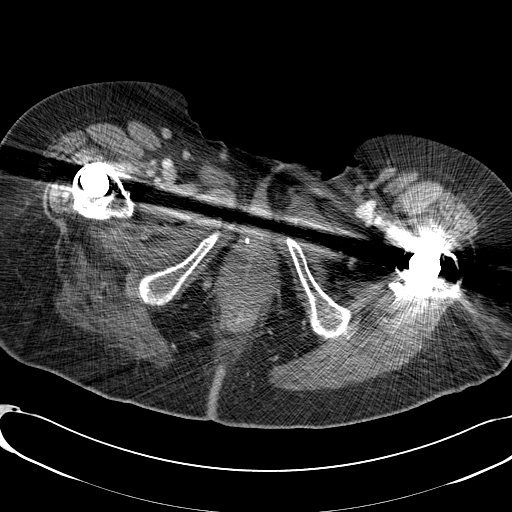
[im 5/80  bone]
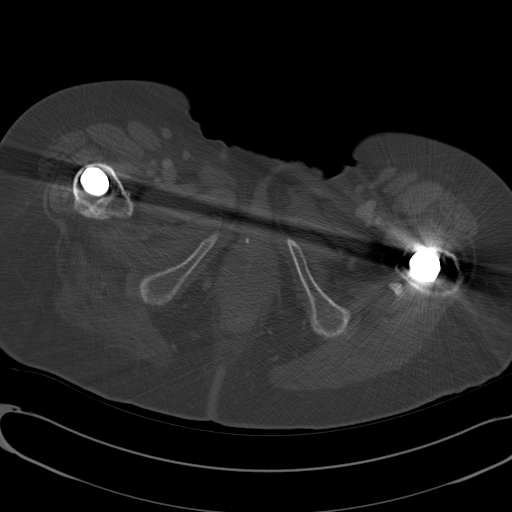
[im 13/80  soft-tissue]
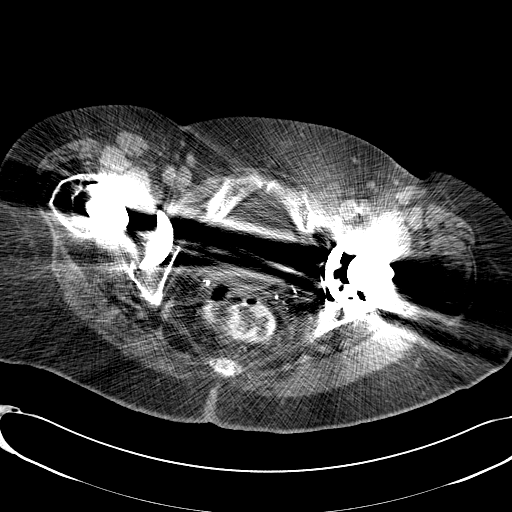
[im 21/80  soft-tissue]
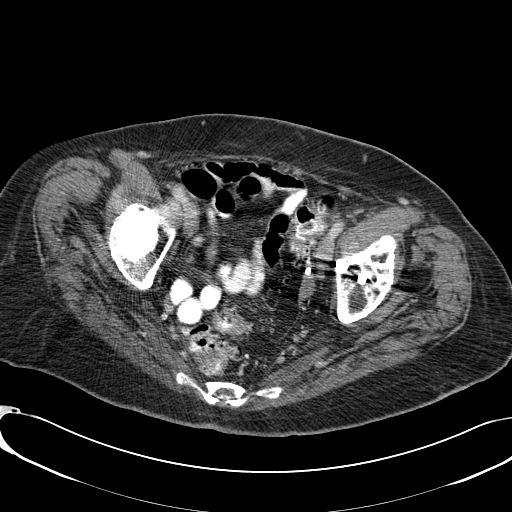
[im 25/80  soft-tissue]
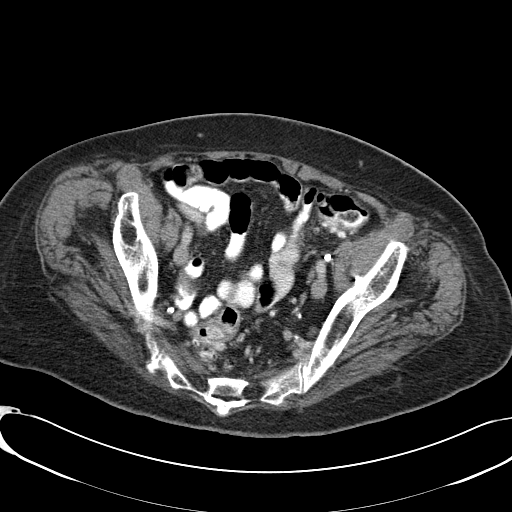
[im 30/80  soft-tissue]
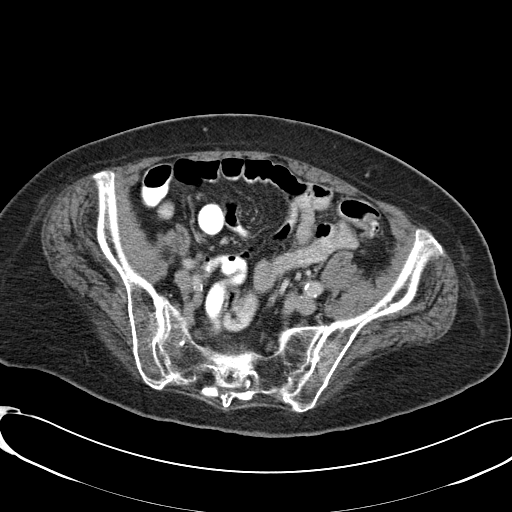
[im 38/80  soft-tissue]
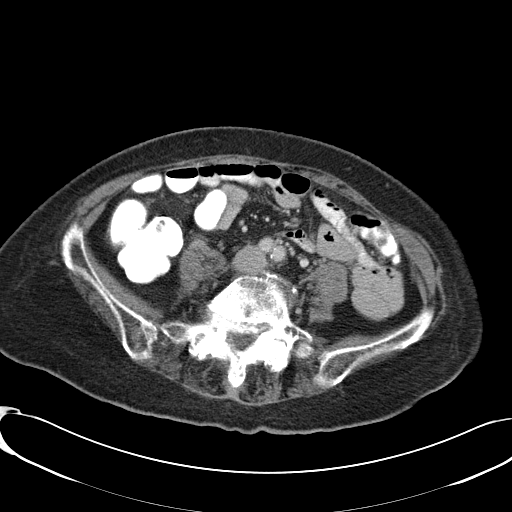
[im 42/80  soft-tissue]
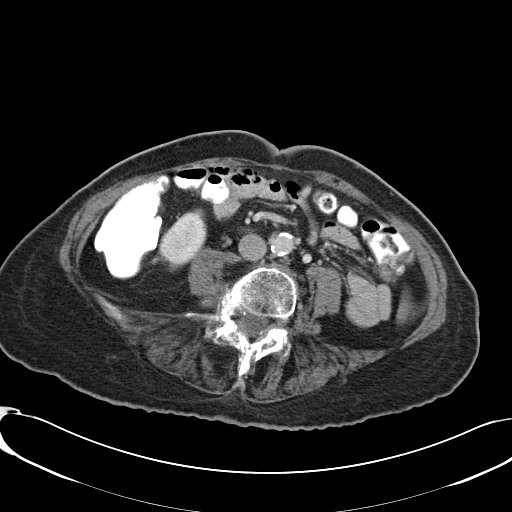
[im 46/80  soft-tissue]
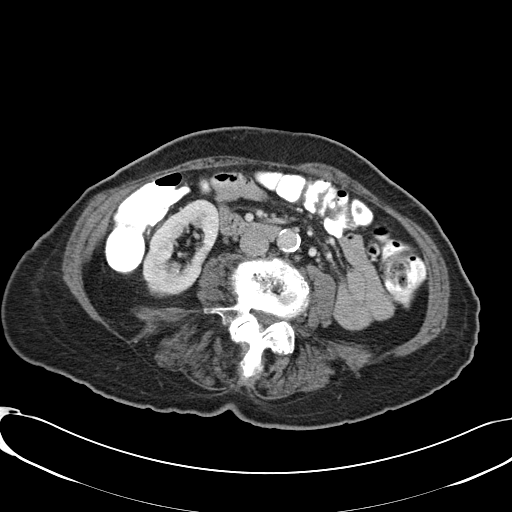
[im 55/80  soft-tissue]
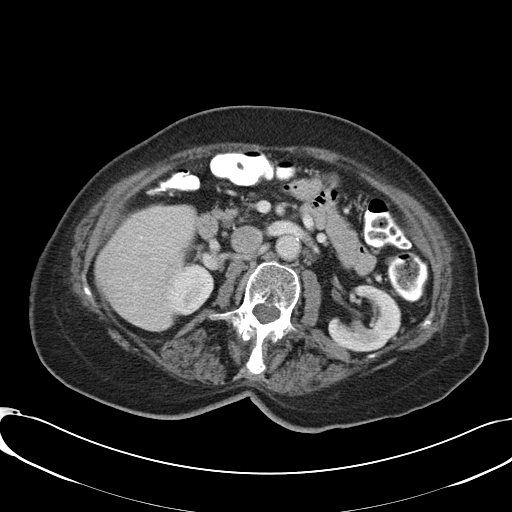
[im 55/80  bone]
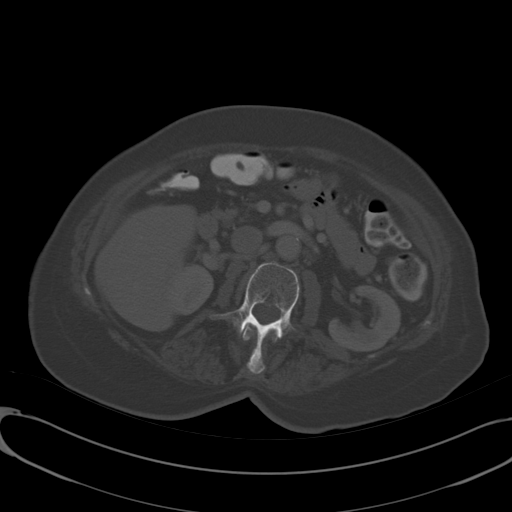
[im 59/80  soft-tissue]
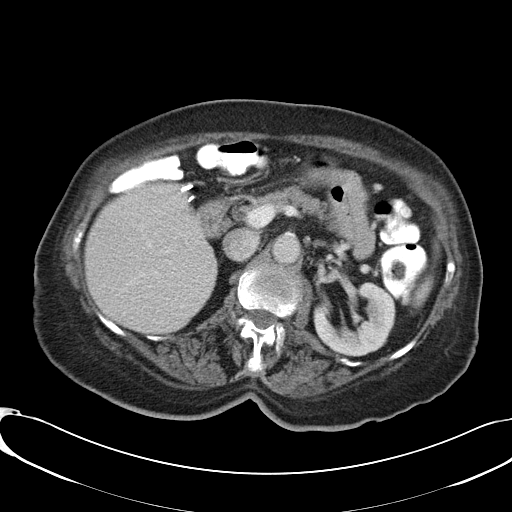
[im 63/80  soft-tissue]
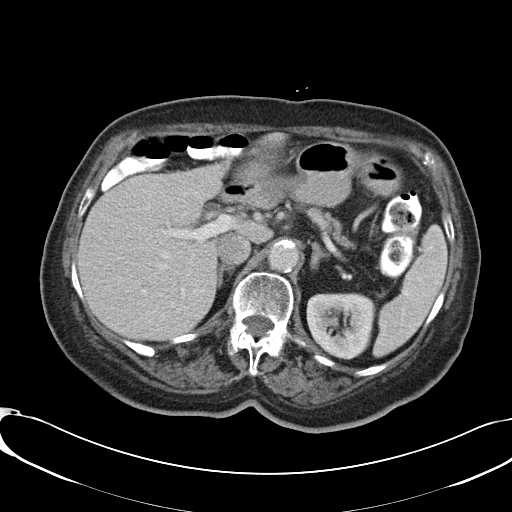
[im 71/80  soft-tissue]
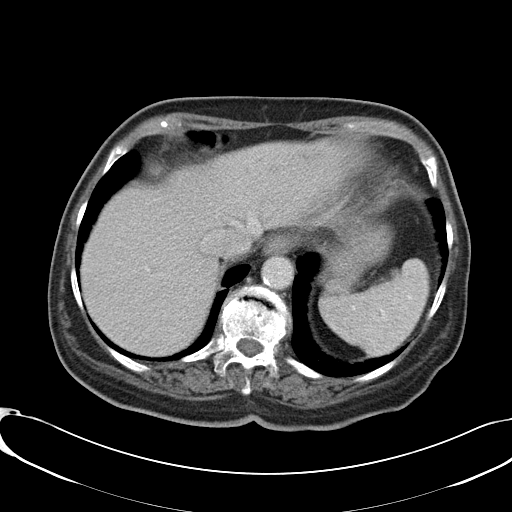
[im 75/80  soft-tissue]
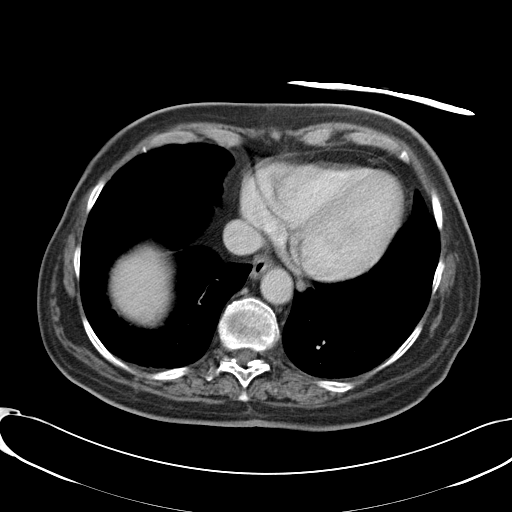

[Series 602: <mpr thick range> · coronal · 0.78mm/px · 3 of 80 slices shown]
[im 27/80  soft-tissue]
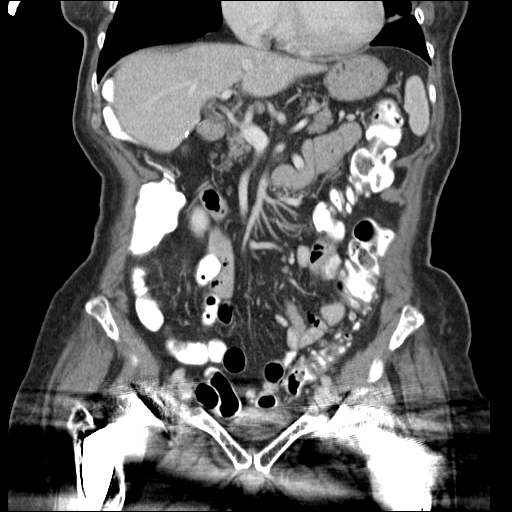
[im 36/80  soft-tissue]
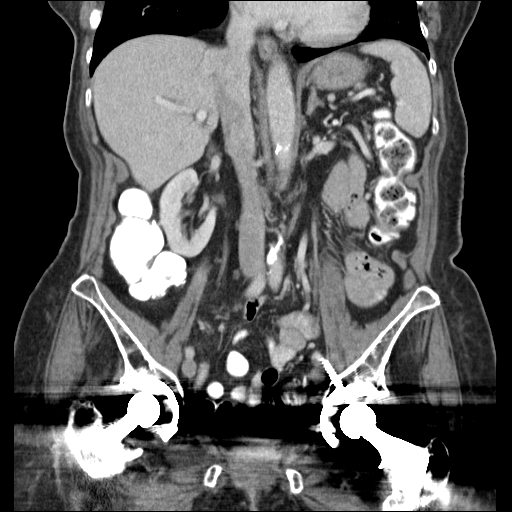
[im 44/80  soft-tissue]
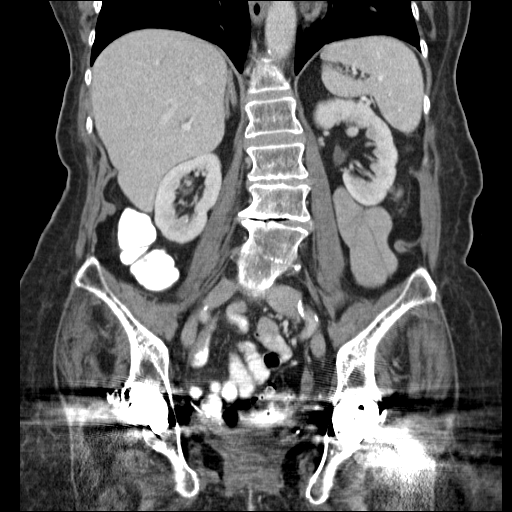

[16 of 46 positions shown; findings below may reference images not displayed]

FINDINGS: Linear lingular and bibasilar scarring is stable.  Remote
right posterior ninth rib fracture again noted.  Healing right
sixth, seventh, and eighth rib fractures noted.  Too small to
characterize hypodensity adjacent to the interlobar fissure of the
liver is stable on image 14. Cholecystectomy clips noted. Spleen,
adrenal glands, and pancreas unremarkable.  Previously seen right
lower renal pole calculus has migrated to the level of the
nondilated right renal pelvis on image 32.  No hydronephrosis.  No
left-sided renal or ureteral calculus. Again noted is a vague
hypodense area of adjacent to the right lower renal pole calix that
previously contained the aforementioned calculus but no definite
underlying mass is seen.

Right ovary is normal.  Uterus presumed surgically absent.  Left
ovary likely visualized and normal-appearing on image 60.  There is
extensive streak artifact from bilateral total hip prostheses.
Colonic diverticulosis noted without CT evidence for
diverticulitis.  Leftward scoliosis of the thoracolumbar spine is
noted.  The disc degenerative changes are present.  No new lytic or
sclerotic osseous lesion.  Minimal inferior end plate compression
deformity again noted as T11.  Evidence of prior L4-L5 fusion
noted.
IMPRESSION: No new intra-abdominal or pelvic metastasis or acute abnormality.
No significant interval change.

Migration of nonobstructing right renal calculus to the level of
the right renal pelvis.  If the patient experiences right flank
pain, this could be due to transient obstruction.

## 2012-06-27 ENCOUNTER — Ambulatory Visit (HOSPITAL_COMMUNITY): Payer: Self-pay | Admitting: Psychiatry

## 2012-07-17 ENCOUNTER — Ambulatory Visit: Payer: Self-pay | Admitting: Oncology

## 2012-07-17 ENCOUNTER — Other Ambulatory Visit: Payer: Self-pay | Admitting: Lab

## 2013-06-14 DEATH — deceased

## 2013-07-14 ENCOUNTER — Telehealth: Payer: Self-pay

## 2013-07-14 NOTE — Telephone Encounter (Signed)
Patient past away per [facility] [place] °
